# Patient Record
Sex: Male | Born: 1940 | Race: White | Hispanic: No | Marital: Married | Smoking: Never smoker
Health system: Southern US, Community
[De-identification: ages and names within clinical notes are randomized; demographics above are authoritative.]

## PROBLEM LIST (undated history)

## (undated) HISTORY — PX: EYE SURGERY: SHX253

---

## 2007-03-11 ENCOUNTER — Emergency Department (HOSPITAL_COMMUNITY): Admission: EM | Admit: 2007-03-11 | Discharge: 2007-03-11 | Payer: Self-pay | Admitting: Emergency Medicine

## 2010-10-11 LAB — CBC
HCT: 46.1
Hemoglobin: 15.9
MCHC: 34.5
Platelets: 178
RDW: 13.1

## 2010-10-11 LAB — BASIC METABOLIC PANEL
BUN: 9
CO2: 22
GFR calc non Af Amer: >60
Glucose, Bld: 114 — ABNORMAL HIGH
Potassium: 3.9
Sodium: 134 — ABNORMAL LOW

## 2010-10-11 LAB — DIFFERENTIAL
Basophils Absolute: 0
Basophils Relative: 1
Eosinophils Absolute: 0.1
Eosinophils Relative: 1
Lymphocytes Relative: 20
Monocytes Absolute: 0.5

## 2010-10-11 LAB — URINALYSIS, ROUTINE W REFLEX MICROSCOPIC
Bilirubin Urine: NEGATIVE
Ketones, ur: NEGATIVE
Leukocytes, UA: NEGATIVE
Nitrite: NEGATIVE
Protein, ur: 30 — AB
Urobilinogen, UA: 0.2

## 2010-10-11 LAB — RAPID URINE DRUG SCREEN, HOSP PERFORMED
Amphetamines: NOT DETECTED
Benzodiazepines: NOT DETECTED
Cocaine: NOT DETECTED
Tetrahydrocannabinol: NOT DETECTED

## 2010-10-11 LAB — URINE MICROSCOPIC-ADD ON

## 2012-07-08 ENCOUNTER — Emergency Department (HOSPITAL_COMMUNITY)
Admission: EM | Admit: 2012-07-08 | Discharge: 2012-07-09 | Disposition: A | Discharge status: Home / Self Care | Payer: Medicare PPO | Attending: Emergency Medicine | Admitting: Emergency Medicine

## 2012-07-08 ENCOUNTER — Encounter (HOSPITAL_COMMUNITY): Payer: Self-pay | Admitting: *Deleted

## 2012-07-08 DIAGNOSIS — R6889 Other general symptoms and signs: Secondary | ICD-10-CM | POA: Insufficient documentation

## 2012-07-08 DIAGNOSIS — Z7982 Long term (current) use of aspirin: Secondary | ICD-10-CM | POA: Insufficient documentation

## 2012-07-08 DIAGNOSIS — T4995XA Adverse effect of unspecified topical agent, initial encounter: Secondary | ICD-10-CM | POA: Insufficient documentation

## 2012-07-08 DIAGNOSIS — T7840XA Allergy, unspecified, initial encounter: Secondary | ICD-10-CM

## 2012-07-08 DIAGNOSIS — R21 Rash and other nonspecific skin eruption: Secondary | ICD-10-CM | POA: Insufficient documentation

## 2012-07-08 MED ORDER — FAMOTIDINE IN NACL 20-0.9 MG/50ML-% IV SOLN
20.0000 mg | Freq: Once | INTRAVENOUS | Status: AC
  Administered 2012-07-08: 20 mg via INTRAVENOUS
  Filled 2012-07-08: qty 50

## 2012-07-08 MED ORDER — SODIUM CHLORIDE 0.9 % IV BOLUS (SEPSIS)
1000.0000 mL | Freq: Once | INTRAVENOUS | Status: AC
  Administered 2012-07-08: 1000 mL via INTRAVENOUS

## 2012-07-08 MED ORDER — PREDNISONE 20 MG PO TABS: 20.0000 mg | ORAL_TABLET | Freq: Every day | ORAL | Status: DC

## 2012-07-08 MED ORDER — DIPHENHYDRAMINE HCL 50 MG/ML IJ SOLN
25.0000 mg | Freq: Once | INTRAMUSCULAR | Status: AC
  Administered 2012-07-08: 25 mg via INTRAVENOUS
  Filled 2012-07-08: qty 1

## 2012-07-08 MED ORDER — METHYLPREDNISOLONE SODIUM SUCC 125 MG IJ SOLR
125.0000 mg | Freq: Once | INTRAMUSCULAR | Status: AC
  Administered 2012-07-08: 125 mg via INTRAVENOUS
  Filled 2012-07-08: qty 2

## 2012-07-08 NOTE — ED Notes (Signed)
Pt w/ notable rash around mid section, & underarms. Pt states feels like throat is tightening up.

## 2012-07-08 NOTE — ED Provider Notes (Signed)
History     CSN: [id number]  Arrival date & time 07/08/12  2239   First MD Initiated Contact with Patient 07/08/12 2250      Chief Complaint  Patient presents with  . Rash  . throat tightening     (Consider location/radiation/quality/duration/timing/severity/associated sxs/prior treatment) HPI...Marland KitchenMarland KitchenMarland Kitchen rash, sense of throat tightening approximately one and a half hours ago.    No dyspnea, chest pain.   This is never happened before. No known allergens. Severity is moderate. No treatment at home.  History reviewed. No pertinent past medical history.  History reviewed. No pertinent past surgical history.  History reviewed. No pertinent family history.  History  Substance Use Topics  . Smoking status: Never Smoker   . Smokeless tobacco: Not on file  . Alcohol Use: Yes      Review of Systems  All other systems reviewed and are negative.    Allergies  Review of patient's allergies indicates no known allergies.  Home Medications   Current Outpatient Rx  Name  Route  Sig  Dispense  Refill  . aspirin 325 MG tablet   Oral   Take 325 mg by mouth daily.           BP 140/90  Pulse 71  Temp(Src) 97.3 F (36.3 C) (Oral)  Resp 16  Ht 6\' 1"  (1.854 m)  Wt 180 lb (81.647 kg)  BMI 23.75 kg/m2  SpO2 98%  Physical Exam  Nursing note and vitals reviewed. Constitutional: He is oriented to person, place, and time. He appears well-developed and well-nourished.  Good airway  HENT:  Head: Normocephalic and atraumatic.  Eyes: Conjunctivae and EOM are normal. Pupils are equal, round, and reactive to light.  Neck: Normal range of motion. Neck supple.  Cardiovascular: Normal rate, regular rhythm and normal heart sounds.   Pulmonary/Chest: Effort normal and breath sounds normal.  Abdominal: Soft. Bowel sounds are normal.  Musculoskeletal: Normal range of motion.  Neurological: He is alert and oriented to person, place, and time.  Skin:  Skin is flushed;  urticarial rash and  suprapubic area and perineal area.  Small satellite rash on forearms  Psychiatric: He has a normal mood and affect.    ED Course  Procedures (including critical care time)  Labs Reviewed - No data to display No results found.   No diagnosis found.    MDM  Uncertain etiology of allergic reaction.    Patient feeling better after IV Solu-Medrol, Benadryl, Pepcid        Donnetta Hutching, MD 07/11/12 [phone]

## 2012-07-08 NOTE — ED Notes (Signed)
Pt all of a sudden broke out in a rash an hour and a half ago and now states his throat feels tight.

## 2012-07-09 NOTE — ED Notes (Signed)
Pt alert & oriented x4, stable gait. Patient given discharge instructions, paperwork & prescription(s). Patient  instructed to stop at the registration desk to finish any additional paperwork. Patient verbalized understanding. Pt left department w/ no further questions. 

## 2012-08-17 ENCOUNTER — Encounter (HOSPITAL_COMMUNITY): Payer: Self-pay

## 2012-08-17 ENCOUNTER — Emergency Department (HOSPITAL_COMMUNITY)
Admission: EM | Admit: 2012-08-17 | Discharge: 2012-08-17 | Disposition: A | Discharge status: Home / Self Care | Payer: Medicare PPO | Attending: Emergency Medicine | Admitting: Emergency Medicine

## 2012-08-17 DIAGNOSIS — R221 Localized swelling, mass and lump, neck: Secondary | ICD-10-CM | POA: Insufficient documentation

## 2012-08-17 DIAGNOSIS — T7840XA Allergy, unspecified, initial encounter: Secondary | ICD-10-CM

## 2012-08-17 DIAGNOSIS — Z7982 Long term (current) use of aspirin: Secondary | ICD-10-CM | POA: Insufficient documentation

## 2012-08-17 DIAGNOSIS — Z79899 Other long term (current) drug therapy: Secondary | ICD-10-CM | POA: Insufficient documentation

## 2012-08-17 DIAGNOSIS — L500 Allergic urticaria: Secondary | ICD-10-CM | POA: Insufficient documentation

## 2012-08-17 DIAGNOSIS — R220 Localized swelling, mass and lump, head: Secondary | ICD-10-CM | POA: Insufficient documentation

## 2012-08-17 LAB — CBC WITH DIFFERENTIAL/PLATELET
Basophils Absolute: 0 10*3/uL (ref 0.0–0.1)
Basophils Relative: 0 % (ref 0–1)
Eosinophils Absolute: 0.1 10*3/uL (ref 0.0–0.7)
Eosinophils Relative: 1 % (ref 0–5)
HCT: 49.4 % (ref 39.0–52.0)
Hemoglobin: 17.6 g/dL — ABNORMAL HIGH (ref 13.0–17.0)
Lymphocytes Relative: 28 % (ref 12–46)
Lymphs Abs: 2.1 10*3/uL (ref 0.7–4.0)
MCH: 32.4 pg (ref 26.0–34.0)
MCHC: 35.6 g/dL (ref 30.0–36.0)
MCV: 91 fL (ref 78.0–100.0)
Monocytes Absolute: 0.6 10*3/uL (ref 0.1–1.0)
Monocytes Relative: 9 % (ref 3–12)
Neutro Abs: 4.6 10*3/uL (ref 1.7–7.7)
Neutrophils Relative %: 62 % (ref 43–77)
Platelets: 220 10*3/uL (ref 150–400)
RBC: 5.43 MIL/uL (ref 4.22–5.81)
RDW: 12.4 % (ref 11.5–15.5)
WBC: 7.4 10*3/uL (ref 4.0–10.5)

## 2012-08-17 LAB — BASIC METABOLIC PANEL
BUN: 10 mg/dL (ref 6–23)
CO2: 27 mEq/L (ref 19–32)
Calcium: 9.6 mg/dL (ref 8.4–10.5)
Chloride: 94 mEq/L — ABNORMAL LOW (ref 96–112)
Creatinine, Ser: 0.91 mg/dL (ref 0.50–1.35)
GFR calc Af Amer: >90 mL/min (ref >90)
GFR calc non Af Amer: 81 mL/min — ABNORMAL LOW (ref >90)
Glucose, Bld: 108 mg/dL — ABNORMAL HIGH (ref 70–99)
Potassium: 4.3 mEq/L (ref 3.5–5.1)
Sodium: 134 mEq/L — ABNORMAL LOW (ref 135–145)

## 2012-08-17 MED ORDER — EPINEPHRINE 0.3 MG/0.3ML IJ SOAJ
INTRAMUSCULAR | Status: AC
  Administered 2012-08-17: 0.3 mg
  Filled 2012-08-17: qty 0.3

## 2012-08-17 MED ORDER — DIPHENHYDRAMINE HCL 50 MG/ML IJ SOLN
25.0000 mg | Freq: Once | INTRAMUSCULAR | Status: AC
  Administered 2012-08-17: 25 mg via INTRAVENOUS

## 2012-08-17 MED ORDER — EPINEPHRINE 0.3 MG/0.3ML IJ SOAJ
INTRAMUSCULAR | Status: AC
  Filled 2012-08-17: qty 0.3

## 2012-08-17 MED ORDER — SODIUM CHLORIDE 0.9 % IV BOLUS (SEPSIS)
1000.0000 mL | Freq: Once | INTRAVENOUS | Status: AC
  Administered 2012-08-17: 1000 mL via INTRAVENOUS

## 2012-08-17 MED ORDER — EPINEPHRINE 0.3 MG/0.3ML IJ SOAJ: 0.3000 mg | INTRAMUSCULAR | Status: DC | PRN

## 2012-08-17 MED ORDER — DIPHENHYDRAMINE HCL 50 MG/ML IJ SOLN
INTRAMUSCULAR | Status: AC
  Filled 2012-08-17: qty 1

## 2012-08-17 MED ORDER — METHYLPREDNISOLONE SODIUM SUCC 125 MG IJ SOLR
INTRAMUSCULAR | Status: AC
  Administered 2012-08-17: 16:00:00
  Filled 2012-08-17: qty 2

## 2012-08-17 MED ORDER — FAMOTIDINE IN NACL 20-0.9 MG/50ML-% IV SOLN
INTRAVENOUS | Status: AC
  Administered 2012-08-17: 17:00:00
  Filled 2012-08-17: qty 50

## 2012-08-17 NOTE — ED Notes (Signed)
Pt complain of allergic reaction. States his tongue is swelling. States this is the second time this has happened but this time it is worse. Pt's face neck and stomach are red with raised rash.

## 2012-08-17 NOTE — ED Provider Notes (Signed)
CSN: [id number]     Arrival date & time 08/17/12  1512 History  This chart was scribed for Jose Razor, MD by Bennett Scrape, ED Scribe. This patient was seen in room [facility]07/[facility]07 and the patient's care was started at 3:18 PM.  None    Chief Complaint  Patient presents with  . Allergic Reaction    The history is provided by the patient. No language interpreter was used.    HPI Comments: Jose Villarreal is a 72 y.o. male who presents to the Emergency Department complaining of a sudden onset, gradually worsening allergic reaction described as neck swelling that started 20 minutes ago with associated tongue swelling and diffuse rash that started 15 minutes ago. Wife states that the pt's voice sounds "more sluggish" than normal as well. Pt took 2 benadryl and one ASA at the start of the symptoms with no improvement. He ate at [facility] earlier but denies eating anything new. He had a similar episode one month ago with an unknown cause. He was given IV Solu-Medrol, Benadryl and Pepcid with improvement. He denies being given prednisone and admits that he declined a prescription for epi pen. He denies any prior episodes before last month. He denies any recent move, changes in medication or changes in hygiene products. He denies abdominal pain, dizziness, lightheadedness and diarrhea.  No past medical history on file.  No past surgical history on file.  No family history on file.  History  Substance Use Topics  . Smoking status: Never Smoker   . Smokeless tobacco: Not on file  . Alcohol Use: Yes    Review of Systems  HENT: Negative for trouble swallowing.        Positive for neck and throat swelling  Gastrointestinal: Negative for abdominal pain and diarrhea.  Skin: Positive for rash.  Neurological: Negative for light-headedness.  All other systems reviewed and are negative.    Allergies  Review of patient's allergies indicates no known allergies.  Home Medications   Current  Outpatient Rx  Name  Route  Sig  Dispense  Refill  . aspirin 325 MG tablet   Oral   Take 325 mg by mouth daily.         . predniSONE (DELTASONE) 20 MG tablet   Oral   Take 1 tablet (20 mg total) by mouth daily.   3 tablet   2    Triage Vitals: Temp(Src) 97.7 F (36.5 C) (Oral)  Ht 6' (1.829 m)  Wt 176 lb 5 oz (79.975 kg)  BMI 23.91 kg/m2  SpO2 96%  Physical Exam  Nursing note and vitals reviewed. Constitutional: He is oriented to person, place, and time. He appears well-developed and well-nourished. No distress.  HENT:  Head: Normocephalic and atraumatic.  Swelling of tongue, handling secretions, voice sounds somewhat muffled   Eyes: Conjunctivae and EOM are normal.  Neck: Normal range of motion. Neck supple. No tracheal deviation present.  Swelling of neck  Cardiovascular: Normal rate, regular rhythm and normal heart sounds.   No murmur heard. Pulmonary/Chest: Effort normal and breath sounds normal. No stridor. No respiratory distress. He has no wheezes. He has no rales.  Abdominal: Soft. Bowel sounds are normal. There is no tenderness.  Musculoskeletal: Normal range of motion. He exhibits no edema.  Neurological: He is alert and oriented to person, place, and time. No cranial nerve deficit.  Skin: Skin is warm and dry. Rash noted.  Diffuse urticarial rash   Psychiatric: He has a normal mood and affect.  His behavior is normal.    ED Course   Procedures (including critical care time)  Medications  EPINEPHrine (EPI-PEN) 0.3 mg/0.3 mL injection (not administered)  diphenhydrAMINE (BENADRYL) 50 MG/ML injection (not administered)  methylPREDNISolone sodium succinate (SOLU-MEDROL) 125 mg/2 mL injection (not administered)  EPINEPHrine (EPI-PEN) 0.3 mg/0.3 mL injection (not administered)    DIAGNOSTIC STUDIES: Oxygen Saturation is 96% on room air, normal by my interpretation.    COORDINATION OF CARE: 3:23 PM-ED staff administered epi pen into left thigh. 3:32  PM-Discussed treatment plan which includes medications and observation for a rebound reaction with pt at bedside and pt agreed to plan.  3:36 PM-Pt rechecked and feels mildly improved. 6:22 PM- Pt rechecked and is feeling improved. Upon re-exam, tongue and neck swelling and rash have improved. Advised pt that observation should only be one more hour. 7:26 PM-Discussed discharge plan which includes epi pen and benadryl with pt and pt agreed to plan. Advised pt that if he uses the epi pen, he will need to be evaluated. Also advised pt to follow up with an allergist to determine the cause of his symptoms and pt agreed. Addressed symptoms to return for with pt.    1. Allergic reaction, initial encounter     MDM   6:32 PM Pt with much improved symptoms. Tongue and neck swelling resolved. Still has urticarial rash on arms, but improved. Continues to deny GI complaints. Has remained HD stable. Will continue to observe. If continues to improve anticipate discharge.   Patient was observed for 4 hours after administration of epinephrine. Symptoms continued to improve. Hemodynamically stable prior to discharge. Respiratory complaints. He still has a mild rash, but this is markedly improved as well. Will be discharge the prescription for epinephrine pens. Allergy/immunology followup. Emergent return precautions discussed.   I personally preformed the services scribed in my presence. The recorded information has been reviewed is accurate. Jose Razor, MD.   Jose Razor, MD 08/23/12 1027

## 2012-08-17 NOTE — ED Notes (Signed)
Pt presents via POV secondary to unknown etiology allergic reaction with noted tongue swelling and raised red rash to upper bilateral extremities, face and torso. Pt's swelling of tongue is improving after epi pen administration.  Dr Juleen China at bedside. SAO2 96 % on room air. Will continue to monitor

## 2018-10-30 ENCOUNTER — Emergency Department (HOSPITAL_COMMUNITY): Payer: Medicare Other

## 2018-10-30 ENCOUNTER — Encounter (HOSPITAL_COMMUNITY): Payer: Self-pay | Admitting: Emergency Medicine

## 2018-10-30 ENCOUNTER — Other Ambulatory Visit: Payer: Self-pay

## 2018-10-30 ENCOUNTER — Emergency Department (HOSPITAL_COMMUNITY)
Admission: EM | Admit: 2018-10-30 | Discharge: 2018-10-30 | Disposition: A | Discharge status: Home / Self Care | Payer: Medicare Other | Attending: Emergency Medicine | Admitting: Emergency Medicine

## 2018-10-30 DIAGNOSIS — I714 Abdominal aortic aneurysm, without rupture, unspecified: Secondary | ICD-10-CM

## 2018-10-30 DIAGNOSIS — R531 Weakness: Secondary | ICD-10-CM | POA: Diagnosis present

## 2018-10-30 DIAGNOSIS — K4020 Bilateral inguinal hernia, without obstruction or gangrene, not specified as recurrent: Secondary | ICD-10-CM | POA: Insufficient documentation

## 2018-10-30 LAB — COMPREHENSIVE METABOLIC PANEL
ALT: 14 U/L (ref 0–44)
AST: 32 U/L (ref 15–41)
Albumin: 3.5 g/dL (ref 3.5–5.0)
Alkaline Phosphatase: 61 U/L (ref 38–126)
Anion gap: 11 (ref 5–15)
BUN: 14 mg/dL (ref 8–23)
CO2: 24 mmol/L (ref 22–32)
Calcium: 8.8 mg/dL — ABNORMAL LOW (ref 8.9–10.3)
Chloride: 97 mmol/L — ABNORMAL LOW (ref 98–111)
Creatinine, Ser: 0.79 mg/dL (ref 0.61–1.24)
GFR calc Af Amer: >60 mL/min (ref >60)
GFR calc non Af Amer: >60 mL/min (ref >60)
Glucose, Bld: 100 mg/dL — ABNORMAL HIGH (ref 70–99)
Potassium: 4.4 mmol/L (ref 3.5–5.1)
Sodium: 132 mmol/L — ABNORMAL LOW (ref 135–145)
Total Bilirubin: 1 mg/dL (ref 0.3–1.2)
Total Protein: 7 g/dL (ref 6.5–8.1)

## 2018-10-30 LAB — URINALYSIS, ROUTINE W REFLEX MICROSCOPIC
Bacteria, UA: NONE SEEN
Bilirubin Urine: NEGATIVE
Glucose, UA: NEGATIVE mg/dL
Hgb urine dipstick: NEGATIVE
Ketones, ur: NEGATIVE mg/dL
Leukocytes,Ua: NEGATIVE
Nitrite: NEGATIVE
Protein, ur: 30 mg/dL — AB
Specific Gravity, Urine: 1.024 (ref 1.005–1.030)
pH: 7 (ref 5.0–8.0)

## 2018-10-30 LAB — CBC WITH DIFFERENTIAL/PLATELET
Abs Immature Granulocytes: 0.02 10*3/uL (ref 0.00–0.07)
Basophils Absolute: 0.1 10*3/uL (ref 0.0–0.1)
Basophils Relative: 1 %
Eosinophils Absolute: 0.1 10*3/uL (ref 0.0–0.5)
Eosinophils Relative: 1 %
HCT: 45.5 % (ref 39.0–52.0)
Hemoglobin: 14.9 g/dL (ref 13.0–17.0)
Immature Granulocytes: 0 %
Lymphocytes Relative: 9 %
Lymphs Abs: 0.8 10*3/uL (ref 0.7–4.0)
MCH: 30.9 pg (ref 26.0–34.0)
MCHC: 32.7 g/dL (ref 30.0–36.0)
MCV: 94.4 fL (ref 80.0–100.0)
Monocytes Absolute: 1 10*3/uL (ref 0.1–1.0)
Monocytes Relative: 11 %
Neutro Abs: 7.1 10*3/uL (ref 1.7–7.7)
Neutrophils Relative %: 78 %
Platelets: 313 10*3/uL (ref 150–400)
RBC: 4.82 MIL/uL (ref 4.22–5.81)
RDW: 11.9 % (ref 11.5–15.5)
WBC: 9 10*3/uL (ref 4.0–10.5)
nRBC: 0 % (ref 0.0–0.2)

## 2018-10-30 LAB — TROPONIN I (HIGH SENSITIVITY)
Troponin I (High Sensitivity): 5 ng/L (ref <18)
Troponin I (High Sensitivity): 5 ng/L (ref <18)

## 2018-10-30 LAB — CBG MONITORING, ED: Glucose-Capillary: 92 mg/dL (ref 70–99)

## 2018-10-30 LAB — LIPASE, BLOOD: Lipase: 46 U/L (ref 11–51)

## 2018-10-30 MED ORDER — IOHEXOL 300 MG/ML  SOLN
100.0000 mL | Freq: Once | INTRAMUSCULAR | Status: AC | PRN
  Administered 2018-10-30: 100 mL via INTRAVENOUS

## 2018-10-30 MED ORDER — SODIUM CHLORIDE 0.9% FLUSH: 3.0000 mL | Freq: Once | INTRAVENOUS | Status: DC

## 2018-10-30 MED ORDER — SODIUM CHLORIDE 0.9 % IV BOLUS
1000.0000 mL | Freq: Once | INTRAVENOUS | Status: AC
  Administered 2018-10-30: 1000 mL via INTRAVENOUS

## 2018-10-30 NOTE — Discharge Instructions (Signed)
You were seen today with generalized weakness.  Your tests today showed bilateral inguinal hernias and an enlarged aorta in your abdomen.  You should follow-up with the general surgeon listed for your hernias as they may require surgical repair.  Your abdominal aortic aneurysm requires follow-up ultrasound.  You will need to schedule an appointment with a primary care doctor to establish care who can help with this ultrasound scheduling and with your other issues.  Please return to the emergency department immediately if you develop any new or suddenly worsening symptoms.

## 2018-10-30 NOTE — ED Provider Notes (Signed)
Emergency Department Provider Note   I have reviewed the triage vital signs and the nursing notes.   HISTORY  Chief Complaint Weakness   HPI Jose Villarreal is a 78 y.o. male with no known PMH due to lack of PCP care presents to the emergency department with generalized weakness for "months" but worsening gradually.  Patient states he presents today because he is having difficulty with walking.  The weakness is diffuse and bilateral.  He describes it is worse and has lower extremities but now also involving his left upper extremity.  He denies pain, falls but does have to "crawl up on my tractor" at times.  He states that his stamina is relatively unchanged.  He is not having chest pain, palpitations, fever/chills, nausea, vomiting, diarrhea.  He does not take prescription medications.  He states he has not seen a doctor in approximately 10 years.  He rarely drinks alcohol and does not use drugs.    History reviewed. No pertinent past medical history.  There are no active problems to display for this patient.   History reviewed. No pertinent surgical history.  Allergies Patient has no known allergies.  No family history on file.  Social History Social History   Tobacco Use  . Smoking status: Never Smoker  . Smokeless tobacco: Never Used  Substance Use Topics  . Alcohol use: Yes  . Drug use: No    Review of Systems  Constitutional: No fever/chills. Positive generalized weakness.  Eyes: No visual changes. ENT: No sore throat. Cardiovascular: Denies chest pain. Respiratory: Denies shortness of breath. Gastrointestinal: No abdominal pain.  No nausea, no vomiting.  No diarrhea.  No constipation. Genitourinary: Negative for dysuria. Musculoskeletal: Negative for back pain. Skin: Negative for rash. Neurological: Negative for headaches, focal weakness or numbness.  10-point ROS otherwise negative.  ____________________________________________   PHYSICAL EXAM:   VITAL SIGNS: ED Triage Vitals  Enc Vitals Group     BP 10/30/18 0925 (!) 161/89     Pulse Rate 10/30/18 0925 (!) 107     Resp 10/30/18 0925 16     Temp 10/30/18 0925 97.8 F (36.6 C)     Temp Source 10/30/18 0925 Oral     SpO2 10/30/18 0925 97 %     Weight 10/30/18 0927 171 lb (77.6 kg)     Height 10/30/18 0927 5\' 10"  (1.778 m)   Constitutional: Alert and oriented. Well appearing and in no acute distress. Eyes: Conjunctivae are normal. PERRL.  Head: Atraumatic. Nose: No congestion/rhinnorhea. Mouth/Throat: Mucous membranes are moist.  Neck: No stridor.   Cardiovascular: Tachycardia. Good peripheral circulation. Grossly normal heart sounds.   Respiratory: Normal respiratory effort.  No retractions. Lungs CTAB. Gastrointestinal: Soft and nontender. No distention.  Musculoskeletal: No lower extremity tenderness nor edema. No gross deformities of extremities. Chronic pain in the right knee with limited ROM.  Neurologic:  Normal speech and language. No gross focal neurologic deficits are appreciated. 4+/5 strength in the hip flexors/extensors, quads.  Skin:  Skin is warm, dry and intact. No rash noted.  ____________________________________________   LABS (all labs ordered are listed, but only abnormal results are displayed)  Labs Reviewed  URINALYSIS, ROUTINE W REFLEX MICROSCOPIC - Abnormal; Notable for the following components:      Result Value   Color, Urine AMBER (*)    APPearance HAZY (*)    Protein, ur 30 (*)    All other components within normal limits  COMPREHENSIVE METABOLIC PANEL - Abnormal; Notable for the  following components:   Sodium 132 (*)    Chloride 97 (*)    Glucose, Bld 100 (*)    Calcium 8.8 (*)    All other components within normal limits  LIPASE, BLOOD  CBC WITH DIFFERENTIAL/PLATELET  CBG MONITORING, ED  TROPONIN I (HIGH SENSITIVITY)  TROPONIN I (HIGH SENSITIVITY)   ____________________________________________  EKG   EKG Interpretation   Date/Time:  Tuesday October 30 2018 09:32:23 EDT Ventricular Rate:  99 PR Interval:  192 QRS Duration: 90 QT Interval:  318 QTC Calculation: 408 R Axis:   -35 Text Interpretation:  Normal sinus rhythm Left axis deviation Abnormal ECG no STEMI  Confirmed by Alona Bene [phone]) on 10/30/2018 9:36:58 AM       ____________________________________________  RADIOLOGY  Ct Head Wo Contrast  Result Date: 10/30/2018 CLINICAL DATA:  Weakness for 2 weeks, difficulty ambulating EXAM: CT HEAD WITHOUT CONTRAST TECHNIQUE: Contiguous axial images were obtained from the base of the skull through the vertex without intravenous contrast. COMPARISON:  CT 03/11/2007 FINDINGS: Brain: No evidence of acute infarction, hemorrhage, hydrocephalus, extra-axial collection or mass lesion/mass effect. Scattered low-density changes within the periventricular and subcortical white matter compatible with chronic microvascular ischemic change. Mild diffuse cerebral volume loss. Vascular: Mild atherosclerotic calcifications involving the large vessels of the skull base. No unexpected hyperdense vessel. Skull: Normal. Negative for fracture or focal lesion. Sinuses/Orbits: Partial opacification within the right maxillary sinus. The remaining paranasal sinuses and mastoid air cells are clear. Orbital structures intact. Other: None. IMPRESSION: 1.  No acute intracranial findings. 2.  Chronic microvascular ischemic change and cerebral volume loss. 3.  Chronic right maxillary sinus disease. Electronically Signed   By: Duanne Guess M.D.   On: 10/30/2018 11:59   Mr Brain Wo Contrast  Result Date: 10/30/2018 CLINICAL DATA:  Ataxia, stroke suspected. Weakness for 2 weeks, difficulty walking. EXAM: MRI HEAD WITHOUT CONTRAST TECHNIQUE: Multiplanar, multiecho pulse sequences of the brain and surrounding structures were obtained without intravenous contrast. COMPARISON:  Head CT performed earlier the same day 10/30/2018, head CT  03/11/2007 FINDINGS: Brain: The coronal acquired T2 weighted imaging is significantly motion degraded. There is no convincing evidence of acute infarct. No evidence of intracranial mass. No midline shift or extra-axial fluid collection. No chronic intracranial blood products. Mild scattered T2/FLAIR hyperintensity within the cerebral white matter is nonspecific, but consistent with chronic small vessel ischemic disease. Tiny chronic lacunar infarct within the right cerebellum. Cerebral volume is normal for age. Vascular: There is a large intracranial left vertebral artery which supplies the basilar. The intracranial right vertebral artery is poorly delineated. Flow voids otherwise maintained within the proximal large arterial vessels. Skull and upper cervical spine: No focal marrow lesion Sinuses/Orbits: Visualized orbits demonstrate no acute abnormality. Mild ethmoid sinus mucosal thickening. Small right maxillary sinus mucous retention cyst. No significant mastoid effusion. IMPRESSION: 1. No evidence of acute intracranial abnormality, including acute infarction. 2. 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 3. The intracranial right vertebral artery is poorly delineated, however, likely developmentally small given the presence of a sizable left vertebral artery supplying the basilar artery. 4. Right maxillary sinus mucous retention cyst. Electronically Signed   By: Jackey Loge   On: 10/30/2018 14:11   Ct Abdomen Pelvis W Contrast  Result Date: 10/30/2018 CLINICAL DATA:  Weakness for 2 weeks. EXAM: CT ABDOMEN AND PELVIS WITH CONTRAST TECHNIQUE: Multidetector CT imaging of the abdomen and pelvis was performed using the standard protocol following bolus administration of  intravenous contrast. CONTRAST:  100 ML OMNIPAQUE IOHEXOL 300 MG/ML  SOLN COMPARISON:  None. FINDINGS: Lower chest: Mild dependent atelectasis in the lung bases. No pleural or pericardial  effusion. Hepatobiliary: A few small simple hepatic cysts are noted. Biliary tree is unremarkable. Pancreas: Unremarkable. No pancreatic ductal dilatation or surrounding inflammatory changes. Spleen: Normal in size without focal abnormality. Adrenals/Urinary Tract: The adrenal glands appear normal. A few small simple cysts are seen in each kidney. The kidneys otherwise appear normal. Ureters and urinary bladder are unremarkable. Stomach/Bowel: Stomach is within normal limits. Appendix appears normal. No evidence of bowel wall thickening, distention, or inflammatory changes. Vascular/Lymphatic: There is atherosclerosis. The patient has an abdominal aortic aneurysm measuring 3.7 cm AP by 3.3 cm transverse. Left common iliac artery aneurysm measures 2.2 cm and right common iliac artery aneurysm measures 1.8 cm. No lymphadenopathy. Reproductive: Prostate is unremarkable. Other: The patient has bilateral inguinal hernias. Hernia on the right is larger and contains a loop of small bowel. Musculoskeletal: No acute or focal abnormality. Multilevel degenerative change in the thoracic and lumbar spine noted. IMPRESSION: No acute abnormality. Bilateral inguinal hernias. Hernia on the right is larger and contains a loop of small bowel without obstruction or other complicating feature. 3.7 cm abdominal aortic aneurysm. Recommend followup by ultrasound in 2 years. This recommendation follows ACR consensus guidelines: White Paper of the ACR Incidental Findings Committee II on Vascular Findings. J Am Coll Radiol 2013; 10:789-794. 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 Electronically Signed   By: Inge Rise M.D.   On: 10/30/2018 14:11    ____________________________________________   PROCEDURES  Procedure(s) performed:   Procedures  None  ____________________________________________   INITIAL IMPRESSION / ASSESSMENT AND PLAN / ED COURSE   Pertinent labs & imaging results that were available during my care of the patient were reviewed by me and considered in my medical decision making (see chart for details).   Patient presents to the emergency department with progressive generalized weakness and difficulty walking.  No fever.  Vitals are significant for tachycardia with elevated blood pressure.  No hypoxemia or concern for infection.  No back or neck pain.  No headaches.  Patient has diminished strength throughout which is more pronounced in the lower extremities.  Upper extremity strength and sensation is normal.  No history to suggest an ascending type paralysis.  No rash.  Plan for screening blood work, IV fluids, CT head, and reassess.  CT head and lab work is largely unremarkable.  Obtain CT abdomen pelvis with contrast to evaluate hernias.  He does have bilateral inguinal hernias worse on the right but no obstruction or evidence of incarceration.  MRI brain obtained which shows no acute infarct.  Patient and wife at bedside for discussion.  Patient plans to obtain PCP ASAP.  I provided contact information to assist with this along with contact information for general surgery.  I offered home health, prescription for walker, prescription for wheelchair, but patient and wife declined this for now. Discussed ED return precautions.  ____________________________________________  FINAL CLINICAL IMPRESSION(S) / ED DIAGNOSES  Final diagnoses:  Generalized weakness  Non-recurrent bilateral inguinal hernia without obstruction or gangrene  Abdominal aortic aneurysm (AAA) without rupture (HCC)    MEDICATIONS GIVEN DURING THIS VISIT:  Medications  sodium chloride 0.9 % bolus 1,000 mL (0 mLs Intravenous Stopped 10/30/18 1225)  iohexol (OMNIPAQUE) 300 MG/ML solution 100 mL (100 mLs Intravenous Contrast  Given 10/30/18 1325)     Note:  This document was prepared using Dragon voice recognition software and may include unintentional  dictation errors.  Alona BeneJoshua Lyell Clugston, MD, [facility]    Keiri Solano, Arlyss RepressJoshua G, MD 10/31/18 [phone]

## 2018-10-30 NOTE — ED Triage Notes (Signed)
Patient states weakness x 2 weeks. Patient states problems ambulating due to weakness. Denies N/V/D.

## 2018-11-01 ENCOUNTER — Encounter: Payer: Self-pay | Admitting: General Surgery

## 2018-11-01 ENCOUNTER — Ambulatory Visit: Payer: Medicare Other | Admitting: General Surgery

## 2018-11-01 ENCOUNTER — Other Ambulatory Visit: Payer: Self-pay

## 2018-11-01 VITALS — BP 136/89 | HR 78 | Temp 97.1°F | Resp 18 | Ht 71.0 in | Wt 181.0 lb

## 2018-11-01 DIAGNOSIS — K4020 Bilateral inguinal hernia, without obstruction or gangrene, not specified as recurrent: Secondary | ICD-10-CM | POA: Diagnosis not present

## 2018-11-01 NOTE — Patient Instructions (Signed)
Inguinal Hernia, Adult °An inguinal hernia develops when fat or the intestines push through a weak spot in a muscle where your leg meets your lower abdomen (groin). This creates a bulge. This kind of hernia could also be: °· In your scrotum, if you are male. °· In folds of skin around your vagina, if you are male. °There are three types of inguinal hernias: °· Hernias that can be pushed back into the abdomen (are reducible). This type rarely causes pain. °· Hernias that are not reducible (are incarcerated). °· Hernias that are not reducible and lose their blood supply (are strangulated). This type of hernia requires emergency surgery. °What are the causes? °This condition is caused by having a weak spot in the muscles or tissues in the groin. This weak spot develops over time. The hernia may poke through the weak spot when you suddenly strain your lower abdominal muscles, such as when you: °· Lift a heavy object. °· Strain to have a bowel movement. Constipation can lead to straining. °· Cough. °What increases the risk? °This condition is more likely to develop in: °· Men. °· Pregnant women. °· People who: °? Are overweight. °? Work in jobs that require long periods of standing or heavy lifting. °? Have had an inguinal hernia before. °? Smoke or have lung disease. These factors can lead to long-lasting (chronic) coughing. °What are the signs or symptoms? °Symptoms may depend on the size of the hernia. Often, a small inguinal hernia has no symptoms. Symptoms of a larger hernia may include: °· A lump in the groin area. This is easier to see when standing. It might not be visible when lying down. °· Pain or burning in the groin. This may get worse when lifting, straining, or coughing. °· A dull ache or a feeling of pressure in the groin. °· In men, an unusual lump in the scrotum. °Symptoms of a strangulated inguinal hernia may include: °· A bulge in your groin that is very painful and tender to the touch. °· A bulge  that turns red or purple. °· Fever, nausea, and vomiting. °· Inability to have a bowel movement or to pass gas. °How is this diagnosed? °This condition is diagnosed based on your symptoms, your medical history, and a physical exam. Your health care provider may feel your groin area and ask you to cough. °How is this treated? °Treatment depends on the size of your hernia and whether you have symptoms. If you do not have symptoms, your health care provider may have you watch your hernia carefully and have you come in for follow-up visits. If your hernia is large or if you have symptoms, you may need surgery to repair the hernia. °Follow these instructions at home: °Lifestyle °· Avoid lifting heavy objects. °· Avoid standing for long periods of time. °· Do not use any products that contain nicotine or tobacco, such as cigarettes and e-cigarettes. If you need help quitting, ask your health care provider. °· Maintain a healthy weight. °Preventing constipation °· Take actions to prevent constipation. Constipation leads to straining with bowel movements, which can make a hernia worse or cause a hernia repair to break down. Your health care provider may recommend that you: °? Drink enough fluid to keep your urine pale yellow. °? Eat foods that are high in fiber, such as fresh fruits and vegetables, whole grains, and beans. °? Limit foods that are high in fat and processed sugars, such as fried or sweet foods. °? Take an over-the-counter   or prescription medicine for constipation. °General instructions °· You may try to push the hernia back in place by very gently pressing on it while lying down. Do not try to force the bulge back in if it will not push in easily. °· Watch your hernia for any changes in shape, size, or color. Get help right away if you notice any changes. °· Take over-the-counter and prescription medicines only as told by your health care provider. °· Keep all follow-up visits as told by your health care  provider. This is important. °Contact a health care provider if: °· You have a fever. °· You develop new symptoms. °· Your symptoms get worse. °Get help right away if: °· You have pain in your groin that suddenly gets worse. °· You have a bulge in your groin that: °? Suddenly gets bigger and does not get smaller. °? Becomes red or purple or painful to the touch. °· You are a man and you have a sudden pain in your scrotum, or the size of your scrotum suddenly changes. °· You cannot push the hernia back in place by very gently pressing on it when you are lying down. Do not try to force the bulge back in if it will not push in easily. °· You have nausea or vomiting that does not go away. °· You have a fast heartbeat. °· You cannot have a bowel movement or pass gas. °These symptoms may represent a serious problem that is an emergency. Do not wait to see if the symptoms will go away. Get medical help right away. Call your local emergency services (911 in the [place] °Summary °· An inguinal hernia develops when fat or the intestines push through a weak spot in a muscle where your leg meets your lower abdomen (groin). °· This condition is caused by having a weak spot in muscles or tissue in your groin. °· Symptoms may depend on the size of the hernia, and they may include pain or swelling in your groin. A small inguinal hernia often has no symptoms. °· Treatment may not be needed if you do not have symptoms. If you have symptoms or a large hernia, you may need surgery to repair the hernia. °· Avoid lifting heavy objects. Also avoid standing for long amounts of time. °This information is not intended to replace advice given to you by your health care provider. Make sure you discuss any questions you have with your health care provider. °Document Released: 05/22/2008 Document Revised: 02/04/2017 Document Reviewed: 10/05/2016 °Elsevier Patient Education © 2020 [employer] Inc[employer] ° °

## 2018-11-01 NOTE — Progress Notes (Signed)
Jose Villarreal; [id number]; 11-22-1940   HPI Patient is an 78 year old white male who was referred to my care by Benny Lennert for evaluation treatment of inguinal hernias.  Patient was recently seen in the emergency room for weakness and was found on examination to have bilateral inguinal hernias, right greater than left.  Patient states he recently noted the hernia and seems to have enlarged in size.  He is able to reduce it when lying flat.  He denies any nausea, vomiting, or pain in the inguinal regions.  He has 0 out of 10 groin pain.  They are sometimes worse with straining. History reviewed. No pertinent past medical history.  History reviewed. No pertinent surgical history.  History reviewed. No pertinent family history.  Current Outpatient Medications on File Prior to Visit  Medication Sig Dispense Refill  . aspirin EC 81 MG tablet Take 81 mg by mouth every morning.    Marland Kitchen aspirin 325 MG tablet Take 325 mg by mouth once.     Marland Kitchen EPINEPHrine (EPIPEN) 0.3 mg/0.3 mL SOAJ Inject 0.3 mLs (0.3 mg total) into the muscle as needed. (Patient not taking: Reported on 11/01/2018) 2 Device 1   No current facility-administered medications on file prior to visit.     No Known Allergies  Social History   Substance and Sexual Activity  Alcohol Use Yes    Social History   Tobacco Use  Smoking Status Never Smoker  Smokeless Tobacco Never Used    Review of Systems  Constitutional: Negative.   HENT: Negative.   Eyes: Negative.   Respiratory: Positive for cough.   Cardiovascular: Negative.   Gastrointestinal: Negative.   Genitourinary: Negative.   Musculoskeletal: Positive for joint pain.  Skin: Negative.   Neurological: Negative.   Endo/Heme/Allergies: Negative.   Psychiatric/Behavioral: Negative.     Objective   Vitals:   11/01/18 1103  BP: 136/89  Pulse: 78  Resp: 18  Temp: (!) 97.1 F (36.2 C)  SpO2: 97%    Physical Exam Vitals signs reviewed.  Constitutional:    Appearance: Normal appearance. He is not ill-appearing.  HENT:     Head: Normocephalic and atraumatic.  Cardiovascular:     Rate and Rhythm: Normal rate and regular rhythm.     Heart sounds: Normal heart sounds. No murmur. No friction rub. No gallop.   Pulmonary:     Effort: Pulmonary effort is normal. No respiratory distress.     Breath sounds: Normal breath sounds. No stridor. No wheezing, rhonchi or rales.  Abdominal:     General: Abdomen is flat. Bowel sounds are normal. There is no distension.     Palpations: Abdomen is soft. There is no mass.     Tenderness: There is no abdominal tenderness. There is no guarding or rebound.     Hernia: A hernia is present.     Comments: Bilateral inguinal hernias, right much larger than left.  Both easily reducible.  Genitourinary:    Comments: Genitourinary examination is within normal limits. Skin:    General: Skin is warm and dry.  Neurological:     Mental Status: He is alert and oriented to person, place, and time.    CT scan report reviewed ER notes reviewed Assessment  Bilateral inguinal hernias, right greater than left, asymptomatic Plan   As patient is asymptomatic, no need for surgical intervention at this time.  The risk of incarceration is low given the size of the hernias.  The signs and symptoms of incarceration were fully explained  to the patient and his wife.  Literature was given.  Should he become more symptomatic, he was instructed to return to my office.  He understands and agrees.  Follow-up as needed.

## 2018-11-13 ENCOUNTER — Ambulatory Visit: Payer: Self-pay | Admitting: General Surgery

## 2018-11-21 ENCOUNTER — Ambulatory Visit (INDEPENDENT_AMBULATORY_CARE_PROVIDER_SITE_OTHER): Payer: Medicare Other | Admitting: Family Medicine

## 2018-11-21 ENCOUNTER — Other Ambulatory Visit: Payer: Self-pay

## 2018-11-21 ENCOUNTER — Encounter: Payer: Self-pay | Admitting: Family Medicine

## 2018-11-21 ENCOUNTER — Other Ambulatory Visit (HOSPITAL_COMMUNITY)
Admission: RE | Admit: 2018-11-21 | Discharge: 2018-11-21 | Disposition: A | Discharge status: Home / Self Care | Payer: Medicare Other | Source: Ambulatory Visit | Attending: Family Medicine | Admitting: Family Medicine

## 2018-11-21 ENCOUNTER — Ambulatory Visit (HOSPITAL_COMMUNITY)
Admission: RE | Admit: 2018-11-21 | Discharge: 2018-11-21 | Disposition: A | Discharge status: Home / Self Care | Payer: Medicare Other | Source: Ambulatory Visit | Attending: Family Medicine | Admitting: Family Medicine

## 2018-11-21 VITALS — BP 130/90 | HR 96 | Temp 97.9°F | Ht 67.0 in | Wt 178.6 lb

## 2018-11-21 DIAGNOSIS — M47812 Spondylosis without myelopathy or radiculopathy, cervical region: Secondary | ICD-10-CM | POA: Diagnosis not present

## 2018-11-21 DIAGNOSIS — R009 Unspecified abnormalities of heart beat: Secondary | ICD-10-CM | POA: Insufficient documentation

## 2018-11-21 DIAGNOSIS — M791 Myalgia, unspecified site: Secondary | ICD-10-CM

## 2018-11-21 DIAGNOSIS — M25512 Pain in left shoulder: Secondary | ICD-10-CM | POA: Diagnosis present

## 2018-11-21 DIAGNOSIS — R030 Elevated blood-pressure reading, without diagnosis of hypertension: Secondary | ICD-10-CM | POA: Diagnosis not present

## 2018-11-21 DIAGNOSIS — M79602 Pain in left arm: Secondary | ICD-10-CM

## 2018-11-21 LAB — COMPREHENSIVE METABOLIC PANEL
ALT: 14 U/L (ref 0–44)
AST: 31 U/L (ref 15–41)
Albumin: 3.6 g/dL (ref 3.5–5.0)
Alkaline Phosphatase: 69 U/L (ref 38–126)
Anion gap: 12 (ref 5–15)
BUN: 13 mg/dL (ref 8–23)
CO2: 26 mmol/L (ref 22–32)
Calcium: 9.4 mg/dL (ref 8.9–10.3)
Chloride: 97 mmol/L — ABNORMAL LOW (ref 98–111)
Creatinine, Ser: 0.85 mg/dL (ref 0.61–1.24)
GFR calc Af Amer: >60 mL/min (ref >60)
GFR calc non Af Amer: >60 mL/min (ref >60)
Glucose, Bld: 101 mg/dL — ABNORMAL HIGH (ref 70–99)
Potassium: 4.6 mmol/L (ref 3.5–5.1)
Sodium: 135 mmol/L (ref 135–145)
Total Bilirubin: 0.8 mg/dL (ref 0.3–1.2)
Total Protein: 7.2 g/dL (ref 6.5–8.1)

## 2018-11-21 LAB — LIPID PANEL
Cholesterol: 169 mg/dL (ref 0–200)
HDL: 37 mg/dL — ABNORMAL LOW (ref >40)
LDL Cholesterol: 119 mg/dL — ABNORMAL HIGH (ref 0–99)
Total CHOL/HDL Ratio: 4.6 RATIO
Triglycerides: 66 mg/dL (ref <150)
VLDL: 13 mg/dL (ref 0–40)

## 2018-11-21 LAB — TSH: TSH: 2.37 u[IU]/mL (ref 0.350–4.500)

## 2018-11-21 LAB — CK: Total CK: 48 U/L — ABNORMAL LOW (ref 49–397)

## 2018-11-21 NOTE — Progress Notes (Signed)
New Patient Office Visit  Subjective:  Patient ID: Jose Villarreal, male    DOB: [birth year]  Age: 78 y.o. MRN: [id number]  CC:  Chief Complaint  Patient presents with  . Establish Care  . AAA    needs additional imaging ([place])    HPI Jose Villarreal presents for concern about muscle weakness, left arm pain  pt with appt for cardiology evaluation-concern for weakness and need for AAA-no h/o of tob use Left arm pain-no injury x 4 months-associated with shoulder pain Muscle weakness-noted over the last few months-difficulty with climbing into the tractor. Hernia evaluation-no surgery recommended-pt taking laxatives to prevent straining, no blood noted in stool   Social History  [place] farm, retired Pharmacologist Socioeconomic History  . Marital status: Married    Spouse name: Not on file  . Number of children: Not on file  . Years of education: Not on file  . Highest education level: Not on file  Occupational History  . Not on file  Social Needs  . Financial resource strain: Not on file  . Food insecurity    Worry: Not on file    Inability: Not on file  . Transportation needs    Medical: Not on file    Non-medical: Not on file  Tobacco Use  . Smoking status: Never Smoker  . Smokeless tobacco: Never Used  Substance and Sexual Activity  . Alcohol use: Yes  . Drug use: No  . Sexual activity: Not on file  Lifestyle  . Physical activity    Days per week: Not on file    Minutes per session: Not on file  . Stress: Not on file  Relationships  . Social Herbalist on phone: Not on file    Gets together: Not on file    Attends religious service: Not on file    Active member of club or organization: Not on file    Attends meetings of clubs or organizations: Not on file    Relationship status: Not on file  . Intimate partner violence    Fear of current or ex partner: Not on file    Emotionally abused: Not on file    Physically abused: Not on file   Forced sexual activity: Not on file  Other Topics Concern  . Not on file  Social History Narrative  . Not on file   ROS Review of Systems  Constitutional: Positive for fatigue.  HENT:       Maxillary sinus -chronic noted on MRI  Eyes:       Readers  Respiratory: Negative.   Cardiovascular: Negative.   Gastrointestinal: Negative for constipation and diarrhea.  Genitourinary: Positive for frequency.       Nocturia-2-3 times a day  Musculoskeletal: Positive for myalgias.  Neurological: Negative for numbness and headaches.  Hematological: Negative.   Psychiatric/Behavioral: Negative for sleep disturbance.    Objective:   Today's Vitals: BP 130/90 (BP Location: Left Arm, Patient Position: Sitting, Cuff Size: Normal)   Pulse 96   Temp 97.9 F (36.6 C) (Oral)   Ht 5\' 7"  (1.702 m)   Wt 178 lb 9.6 oz (81 kg)   SpO2 98%   BMI 27.97 kg/m   Physical Exam Constitutional:      Appearance: Normal appearance.  HENT:     Head: Normocephalic and atraumatic.     Right Ear: Tympanic membrane, ear canal and external ear normal.     Left Ear: Tympanic  membrane, ear canal and external ear normal.     Nose: Nose normal.     Mouth/Throat:     Mouth: Mucous membranes are moist.  Eyes:     Conjunctiva/sclera: Conjunctivae normal.  Neck:     Musculoskeletal: Normal range of motion.  Cardiovascular:     Rate and Rhythm: Normal rate and regular rhythm.     Pulses: Normal pulses.  Neurological:     Mental Status: He is alert.     Assessment & Plan:    Outpatient Encounter Medications as of 11/21/2018  Medication Sig  . aspirin 325 MG tablet Take 325 mg by mouth once.   Marland Kitchen aspirin EC 81 MG tablet Take 81 mg by mouth every morning.  Marland Kitchen EPINEPHrine (EPIPEN) 0.3 mg/0.3 mL SOAJ Inject 0.3 mLs (0.3 mg total) into the muscle as needed. (Patient not taking: Reported on 11/01/2018)   No facility-administered encounter medications on file as of 11/21/2018.    1. Myalgia Muscle weakness noted  with getting into the tractor - COMPLETE METABOLIC PANEL WITH GFR - Lipid panel - TSH - CK (Creatine Kinase)  2. Elevated heart rate with elevated blood pressure without diagnosis of hypertension Elevated in the ER-no medication-no CP/SOB - COMPLETE METABOLIC PANEL WITH GFR - Lipid panel - TSH - CK (Creatine Kinase)  3. 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 Left shoulder pain, unspecified chronicity - DG Humerus Left; Future - DG Cervical Spine Complete; Future No injury, no weakness, LROM left shoulder, anterior pain with palpation Follow-up: prn-recommend bp monitor for home use LISA Mat Carne, MD

## 2018-11-21 NOTE — Patient Instructions (Signed)
Fasting labwork-[facility]  xrays at [facility]  Blood pressure monitor

## 2018-12-04 ENCOUNTER — Encounter: Payer: Self-pay | Admitting: Cardiovascular Disease

## 2018-12-04 ENCOUNTER — Other Ambulatory Visit: Payer: Self-pay

## 2018-12-04 ENCOUNTER — Ambulatory Visit: Payer: Medicare Other | Admitting: Cardiovascular Disease

## 2018-12-04 VITALS — BP 151/84 | HR 82 | Temp 97.9°F | Ht 68.0 in | Wt 177.0 lb

## 2018-12-04 DIAGNOSIS — I10 Essential (primary) hypertension: Secondary | ICD-10-CM

## 2018-12-04 DIAGNOSIS — R531 Weakness: Secondary | ICD-10-CM | POA: Diagnosis not present

## 2018-12-04 DIAGNOSIS — I714 Abdominal aortic aneurysm, without rupture, unspecified: Secondary | ICD-10-CM

## 2018-12-04 NOTE — Patient Instructions (Signed)
Medication Instructions:  Your physician recommends that you continue on your current medications as directed. Please refer to the Current Medication list given to you today.  *If you need a refill on your cardiac medications before your next appointment, please call your pharmacy*  Lab Work: None today  If you have labs (blood work) drawn today and your tests are completely normal, you will receive your results only by: Marland Kitchen MyChart Message (if you have MyChart) OR . A paper copy in the mail If you have any lab test that is abnormal or we need to change your treatment, we will call you to review the results.  Testing/Procedures: None today    Follow-Up: As needed      Thank you for choosing [facility] [facility] !

## 2018-12-04 NOTE — Progress Notes (Signed)
CARDIOLOGY CONSULT NOTE  Patient ID: Jose Villarreal MRN: [id number] DOB/AGE: [birth year] 78 y.o.  Admit date: (Not on file) Primary Physician: Maryruth Hancock, MD  Reason for Consultation: Weakness  HPI: Jose Villarreal is a 78 y.o. male who is being seen today for the evaluation of weakness at the request of Dr. Benny Lennert.  He was evaluated in the ED for weakness on 10/30/2018.  I reviewed labs performed on 11/21/2018 which include normal TSH.  Lipid panel showed LDL 119.  High-sensitivity troponins were normal on 10/30/2018.  I personally reviewed the ECG performed on 10/30/2018 which demonstrated sinus rhythm with left axis deviation.  He believes he had some sort of a viral illness in October which she has since recovered from.  He denies chest pain, palpitations, leg swelling, orthopnea and shortness of breath.  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  Social history: His wife, Izora Gala, is also my patient.  No Known Allergies  Current Outpatient Medications  Medication Sig Dispense Refill  . aspirin EC 81 MG tablet Take 81 mg by mouth every morning.     No current facility-administered medications for this visit.     History reviewed. No pertinent past medical history.  History reviewed. No pertinent surgical history.  Social History   Socioeconomic History  . Marital status: Married    Spouse name: Not on file  . Number of children: Not on file  . Years of education: Not on file  . Highest education level: Not on file  Occupational History  . Not on file  Social Needs  . Financial resource strain: Not on file  . Food insecurity    Worry: Not on file    Inability: Not on file  . Transportation needs    Medical: Not on file    Non-medical: Not on file  Tobacco Use  . Smoking status: Never Smoker  . Smokeless tobacco: Never Used  Substance and Sexual Activity  .  Alcohol use: Yes  . Drug use: No  . Sexual activity: Not on file  Lifestyle  . Physical activity    Days per week: Not on file    Minutes per session: Not on file  . Stress: Not on file  Relationships  . Social Herbalist on phone: Not on file    Gets together: Not on file    Attends religious service: Not on file    Active member of club or organization: Not on file    Attends meetings of clubs or organizations: Not on file    Relationship status: Not on file  . Intimate partner violence    Fear of current or ex partner: Not on file    Emotionally abused: Not on file    Physically abused: Not on file    Forced sexual activity: Not on file  Other Topics Concern  . Not on file  Social History Narrative  . Not on file     No family history of premature CAD in 1st degree relatives.  Current Meds  Medication Sig  . aspirin EC 81 MG tablet Take 81 mg by mouth every morning.      Review of systems complete and found to be negative unless listed above in HPI    Physical exam Blood pressure (!) 151/84, pulse 82, temperature 97.9 F (36.6 C), height 5'  8" (1.727 m), weight 177 lb (80.3 kg), SpO2 97 %. General: NAD Neck: No JVD, no thyromegaly or thyroid nodule.  Lungs: Clear to auscultation bilaterally with normal respiratory effort. CV: Nondisplaced PMI. Regular rate and rhythm, normal S1/S2, no S3/S4, no murmur.  No peripheral edema.  No carotid bruit.    Abdomen: Soft, nontender, no distention.  Skin: Intact without lesions or rashes.  Neurologic: Alert and oriented x 3.  Psych: Normal affect. Extremities: No clubbing or cyanosis.  HEENT: Normal.   ECG: Most recent ECG reviewed.   Labs: Lab Results  Component Value Date/Time   K 4.6 11/21/2018 11:13 AM   BUN 13 11/21/2018 11:13 AM   CREATININE 0.85 11/21/2018 11:13 AM   ALT 14 11/21/2018 11:13 AM   TSH 2.370 11/21/2018 11:14 AM   HGB 14.9 10/30/2018 10:08 AM     Lipids: Lab Results  Component  Value Date/Time   LDLCALC 119 (H) 11/21/2018 11:14 AM   CHOL 169 11/21/2018 11:14 AM   TRIG 66 11/21/2018 11:14 AM   HDL 37 (L) 11/21/2018 11:14 AM        ASSESSMENT AND PLAN:   1.  Weakness: Symptomatically improved.  He believes he may have had a viral illness back in October.  Labs reviewed above are unrevealing.  ECG is unremarkable.  He is hypertensive.  2.  Hypertension: Blood pressure is elevated.  He is not on antihypertensive therapy.  This will need further monitoring by his PCP.  3.  Abdominal aortic aneurysm: CT abdomen on 10/30/2018 demonstrated a 3.7 cm abdominal aortic aneurysm.  Repeat abdominal imaging should be performed in 2 years.    Disposition: Follow up prn  Signed: Prentice Docker, M.D., F.A.C.C.  12/04/2018, 1:34 PM

## 2021-06-11 IMAGING — DX DG HUMERUS 2V *L*
2 series · 3 of 3 positions shown · non-contrast
Comparison: None.

CLINICAL DATA: Left shoulder/arm pain radiating down left arm 3-4
months. No injury.

EXAM:
LEFT HUMERUS - 2+ VIEW

[humerus ap]
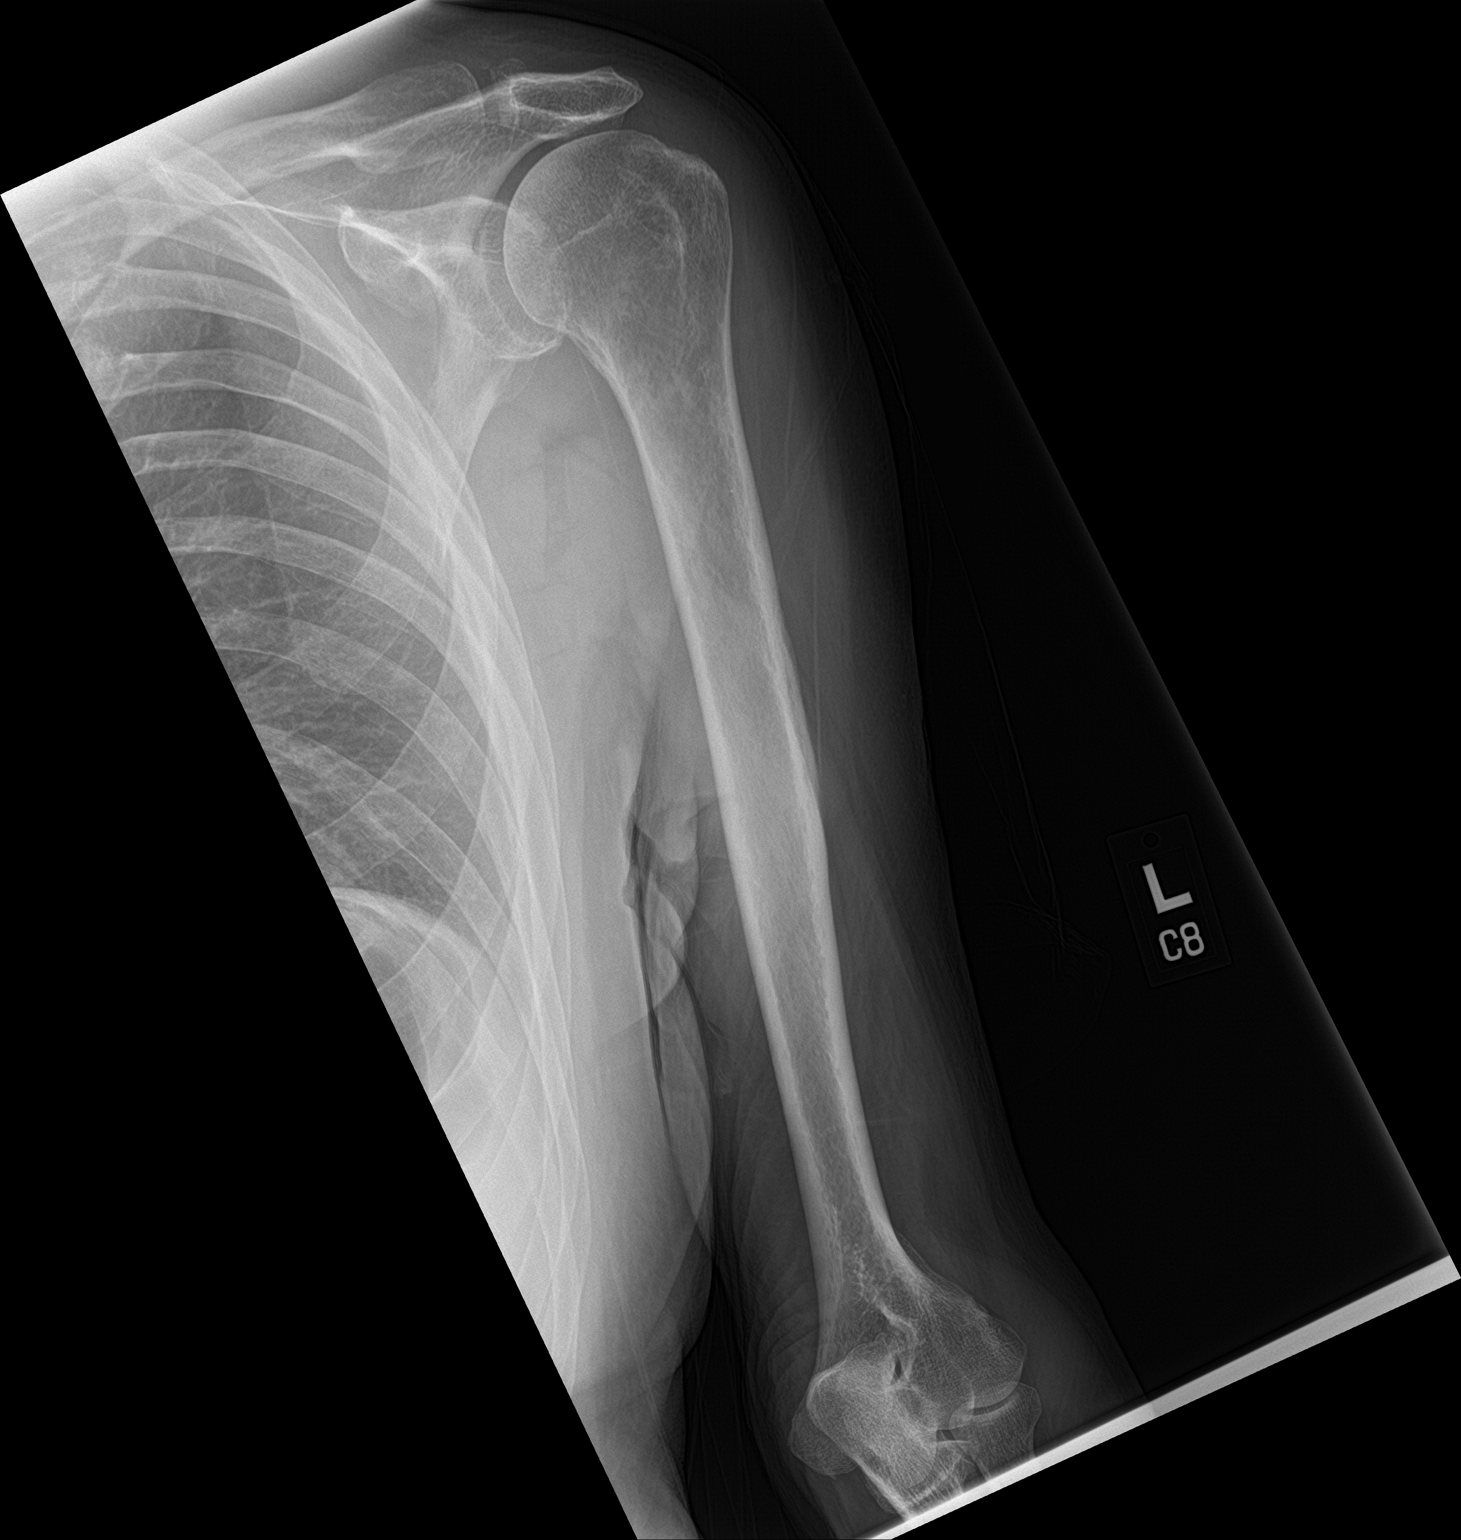

[Series 2: humerus lat · 0.14mm/px · 2 of 2 slices shown]
[im 1/2]
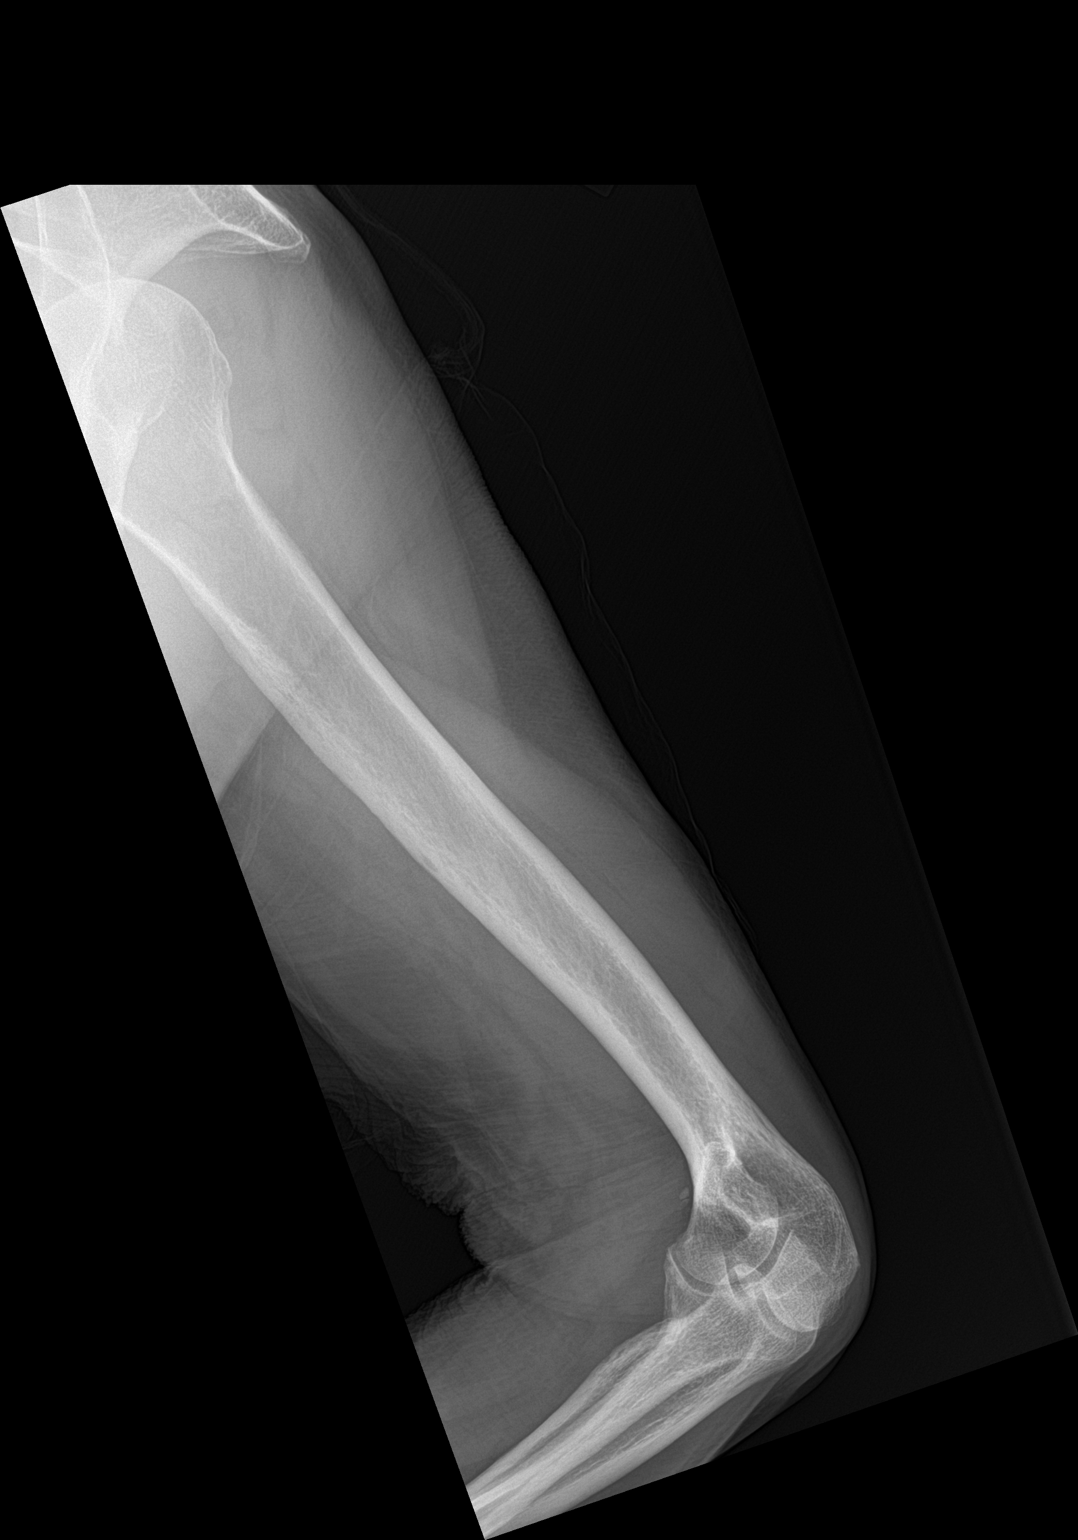
[im 2/2]
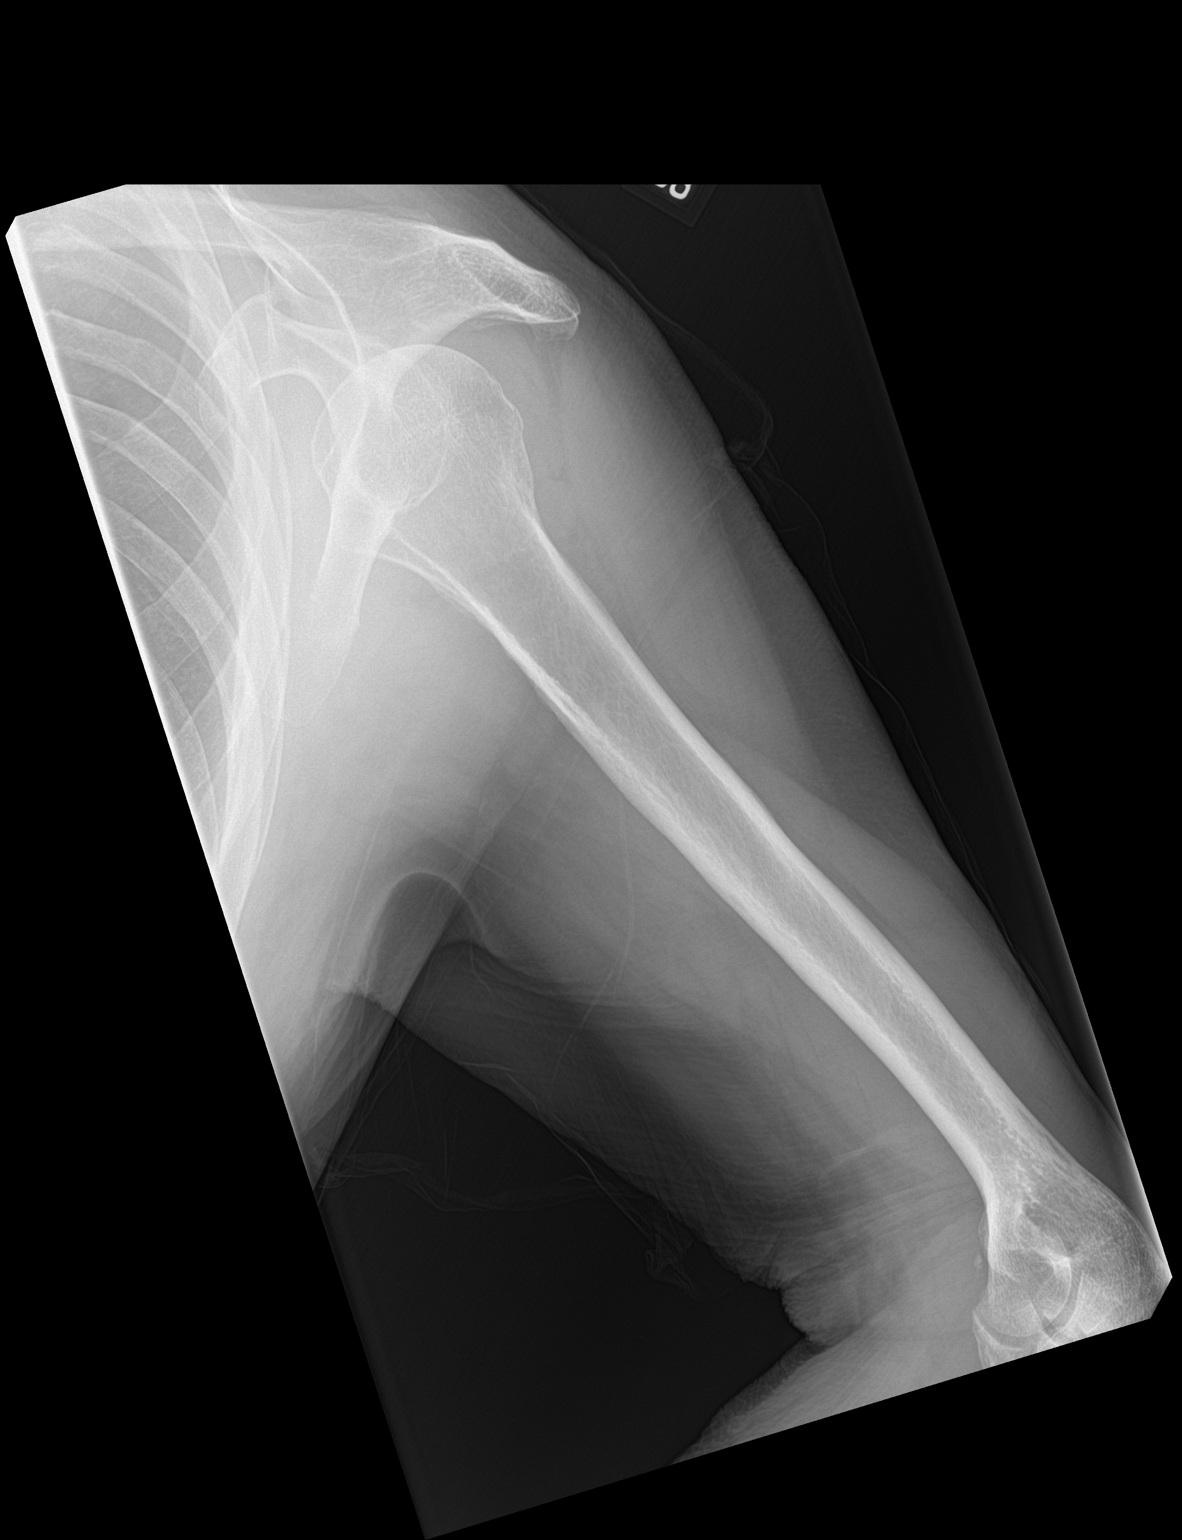

[3 of 3 positions shown; findings below may reference images not displayed]

FINDINGS: Minimal degenerative change over the AC joint. Glenohumeral joint is
within normal. No evidence of acute fracture or dislocation.
Remainder of the visualized left humerus is normal.
IMPRESSION: No acute findings.

## 2022-02-28 ENCOUNTER — Ambulatory Visit (INDEPENDENT_AMBULATORY_CARE_PROVIDER_SITE_OTHER): Payer: Medicare PPO

## 2022-02-28 ENCOUNTER — Encounter: Payer: Self-pay | Admitting: Orthopedic Surgery

## 2022-02-28 ENCOUNTER — Ambulatory Visit: Payer: Self-pay

## 2022-02-28 ENCOUNTER — Ambulatory Visit (INDEPENDENT_AMBULATORY_CARE_PROVIDER_SITE_OTHER): Payer: Medicare PPO | Admitting: Orthopedic Surgery

## 2022-02-28 DIAGNOSIS — M25561 Pain in right knee: Secondary | ICD-10-CM | POA: Diagnosis not present

## 2022-02-28 DIAGNOSIS — M170 Bilateral primary osteoarthritis of knee: Secondary | ICD-10-CM | POA: Diagnosis not present

## 2022-02-28 MED ORDER — BUPIVACAINE HCL 0.25 % IJ SOLN
4.0000 mL | INTRAMUSCULAR | Status: AC | PRN
  Administered 2022-02-28: 4 mL via INTRA_ARTICULAR

## 2022-02-28 MED ORDER — LIDOCAINE HCL 1 % IJ SOLN
5.0000 mL | INTRAMUSCULAR | Status: AC | PRN
  Administered 2022-02-28: 5 mL

## 2022-02-28 NOTE — Progress Notes (Signed)
Office Visit Note   Patient: Jose Villarreal           Date of Birth: November 27, 1940           MRN: [id number] Visit Date: 02/28/2022 Requested by: No referring provider defined for this encounter. PCP: Pcp, No  Subjective: Chief Complaint  Patient presents with   Right Knee - Pain    HPI: Jose Villarreal is a 82 y.o. male who presents to the office reporting right knee pain.  Patient has chronic issues with the right knee.  Describes weakness giving way but no locking.  Pain does not wake him from sleep but he does have some pain at rest.  Definitely has symptoms when he is ambulating and with activity.  Tried a brace years ago but it caused increased swelling.  He has to be careful how he ambulates.  Hard for him to stand as well as climb stairs.  His knee pain does limit his walking endurance.  He is okay sitting and driving..                ROS: All systems reviewed are negative as they relate to the chief complaint within the history of present illness.  Patient denies fevers or chills.  Assessment & Plan: Visit Diagnoses:  1. Right knee pain, unspecified chronicity   2. Primary osteoarthritis of both knees     Plan: Impression is right knee pain and arthritis with asymptomatic left knee.  He has a Baker's cyst in the back of the right knee as well.  Plan is aspiration and injection today.  That is going to help him for some amount of time.  Could consider gel injection as a neck step.  The Baker's cyst is also aspirated of about 15 cc of fluid.  Follow-up as needed.  Follow-Up Instructions: No follow-ups on file.   Orders:  Orders Placed This Encounter  Procedures   XR Knee 1-2 Views Right   US Guided Needle Placement - No Linked Charges   No orders of the defined types were placed in this encounter.     Procedures: Large Joint Inj: R knee on 02/28/2022 8:28 PM Indications: diagnostic evaluation, joint swelling and pain Details: 18 G 1.5 in needle, ultrasound-guided  posterior approach  Arthrogram: No  Medications: 5 mL lidocaine 1 %; 4 mL bupivacaine 0.25 % Aspirate: yellow Outcome: tolerated well, no immediate complications  15 cc aspirated from posterior knee Procedure, treatment alternatives, risks and benefits explained, specific risks discussed. Consent was given by the patient. Immediately prior to procedure a time out was called to verify the correct patient, procedure, equipment, support staff and site/side marked as required. Patient was prepped and draped in the usual sterile fashion.       Clinical Data: No additional findings.  Objective: Vital Signs: There were no vitals taken for this visit.  Physical Exam:  Constitutional: Patient appears well-developed HEENT:  Head: Normocephalic Eyes:EOM are normal Neck: Normal range of motion Cardiovascular: Normal rate Pulmonary/chest: Effort normal Neurologic: Patient is alert Skin: Skin is warm Psychiatric: Patient has normal mood and affect  Ortho Exam: Ortho exam demonstrates varus alignment in the right knee and to a lesser degree left knee.  Pedal pulses palpable.  Range of motion on the right is about 7-1 15.  Has slight laxity to varus and valgus stress.  Extensor mechanism intact.  No groin pain with internal or external rotation of the right knee.Or leg.  Specialty Comments:  No specialty comments available.  Imaging: No results found.   PMFS History: Patient Active Problem List   Diagnosis Date Noted   Myalgia 11/21/2018   Elevated heart rate with elevated blood pressure without diagnosis of hypertension 11/21/2018   Left arm pain 11/21/2018   Left shoulder pain 11/21/2018   No past medical history on file.  Family History  Problem Relation Age of Onset   Parkinson's disease Mother    Heart failure Father     No past surgical history on file. Social History   Occupational History   Not on file  Tobacco Use   Smoking status: Never   Smokeless tobacco:  Never  Vaping Use   Vaping Use: Never used  Substance and Sexual Activity   Alcohol use: Yes   Drug use: No   Sexual activity: Not on file

## 2022-05-09 ENCOUNTER — Ambulatory Visit: Payer: Medicare PPO | Admitting: Family Medicine

## 2022-05-09 ENCOUNTER — Encounter: Payer: Self-pay | Admitting: Family Medicine

## 2022-05-09 VITALS — BP 164/80 | HR 65 | Temp 97.6°F | Ht 68.0 in | Wt 182.0 lb

## 2022-05-09 DIAGNOSIS — R011 Cardiac murmur, unspecified: Secondary | ICD-10-CM

## 2022-05-09 DIAGNOSIS — R030 Elevated blood-pressure reading, without diagnosis of hypertension: Secondary | ICD-10-CM

## 2022-05-09 DIAGNOSIS — I499 Cardiac arrhythmia, unspecified: Secondary | ICD-10-CM

## 2022-05-09 DIAGNOSIS — I714 Abdominal aortic aneurysm, without rupture, unspecified: Secondary | ICD-10-CM

## 2022-05-09 DIAGNOSIS — K4090 Unilateral inguinal hernia, without obstruction or gangrene, not specified as recurrent: Secondary | ICD-10-CM

## 2022-05-09 MED ORDER — LOSARTAN POTASSIUM 50 MG PO TABS: 50.0000 mg | ORAL_TABLET | Freq: Every day | ORAL | 3 refills | Status: AC

## 2022-05-09 NOTE — Patient Instructions (Signed)
It was great to meet you today and I'm excited to have you join the [facility] practice. I hope you had a positive experience today! If you feel so inclined, please feel free to recommend our practice to friends and family. Autymn Omlor, FNP-C  

## 2022-05-09 NOTE — Assessment & Plan Note (Signed)
He is interested in speaking with general surgeon, referral placed.

## 2022-05-09 NOTE — Progress Notes (Signed)
New Patient Office Visit  Subjective    Patient ID: Jose Villarreal, male    DOB: 27-Jun-1940  Age: 82 y.o. MRN: [id number]  CC:  Chief Complaint  Patient presents with   Establish Care    HPI Jose Villarreal presents to establish care. Oriented to practice routines and expectations. He denies any major medical history other than alpha gal and right knee arthritis that he is getting injections with orthopedics for. Review of cardiology note from 2020 reveals elevated blood pressure, and AAA (repeat imaging 2y recommended, not completed).    Outpatient Encounter Medications as of 05/09/2022  Medication Sig   losartan (COZAAR) 50 MG tablet Take 1 tablet (50 mg total) by mouth daily.   [DISCONTINUED] aspirin EC 81 MG tablet Take 81 mg by mouth every morning.   No facility-administered encounter medications on file as of 05/09/2022.    History reviewed. No pertinent past medical history.  Past Surgical History:  Procedure Laterality Date   EYE SURGERY      Family History  Problem Relation Age of Onset   Parkinson's disease Mother    Heart failure Father     Social History   Socioeconomic History   Marital status: Married    Spouse name: Not on file   Number of children: Not on file   Years of education: Not on file   Highest education level: Not on file  Occupational History   Not on file  Tobacco Use   Smoking status: Never   Smokeless tobacco: Never  Vaping Use   Vaping Use: Never used  Substance and Sexual Activity   Alcohol use: Yes   Drug use: No   Sexual activity: Not on file  Other Topics Concern   Not on file  Social History Narrative   Not on file   Social Determinants of Health   Financial Resource Strain: Not on file  Food Insecurity: Not on file  Transportation Needs: Not on file  Physical Activity: Not on file  Stress: Not on file  Social Connections: Not on file  Intimate Partner Violence: Not on file    Review of Systems   Constitutional: Negative.   HENT: Negative.    Eyes: Negative.   Respiratory: Negative.    Cardiovascular: Negative.   Gastrointestinal: Negative.   Genitourinary: Negative.   Musculoskeletal: Negative.   Skin: Negative.   Neurological: Negative.   Endo/Heme/Allergies: Negative.   Psychiatric/Behavioral: Negative.    All other systems reviewed and are negative.       Objective    BP (!) 164/80   Pulse 65   Temp 97.6 F (36.4 C) (Oral)   Ht  (1.727 m)   Wt 182 lb (82.6 kg)   SpO2 97%   BMI 27.67 kg/m     05/09/2022    9:45 AM 05/09/2022    9:37 AM 12/04/2018    1:21 PM  Vitals with BMI  Height     Weight  182 lbs 177 lbs  BMI  27.68 26.92  Systolic 164 168 409  Diastolic 80 90 84  Pulse  65 82      Physical Exam Vitals and nursing note reviewed.  Constitutional:      Appearance: Normal appearance. He is normal weight.  HENT:     Head: Normocephalic and atraumatic.     Nose: Nose normal.     Mouth/Throat:     Mouth: Mucous membranes are moist.     Pharynx:  Oropharynx is clear.  Eyes:     Extraocular Movements: Extraocular movements intact.     Right eye: Normal extraocular motion and no nystagmus.     Left eye: Normal extraocular motion and no nystagmus.     Conjunctiva/sclera: Conjunctivae normal.     Pupils: Pupils are equal, round, and reactive to light.  Cardiovascular:     Rate and Rhythm: Normal rate. Rhythm irregular.     Pulses: Normal pulses.     Heart sounds: Normal heart sounds.  Pulmonary:     Effort: Pulmonary effort is normal.     Breath sounds: Normal breath sounds.  Abdominal:     General: Bowel sounds are normal.     Palpations: Abdomen is soft.     Hernia: A hernia is present. Hernia is present in the right inguinal area.  Genitourinary:    Comments: Deferred using shared decision making Musculoskeletal:        General: Normal range of motion.     Cervical back: Normal range of motion and neck supple.  Skin:     General: Skin is warm and dry.     Capillary Refill: Capillary refill takes less than 2 seconds.  Neurological:     General: No focal deficit present.     Mental Status: He is alert. Mental status is at baseline.  Psychiatric:        Mood and Affect: Mood normal.        Speech: Speech normal.        Behavior: Behavior normal.        Thought Content: Thought content normal.        Cognition and Memory: Cognition and memory normal.        Judgment: Judgment normal.         Assessment & Plan:   Problem List Items Addressed This Visit     Elevated blood pressure reading in office without diagnosis of hypertension - Primary    Start Losartan 50mg  daily and follow up in 2 weeks. Instructed to seek medical care for chest pain, shortness of breath, lightheadedness/dizziness, vision changes, swelling of extremities, or recurrent headaches. Strongly encouraged to obtain BP cuff for home monitoring and report to office if sustained >180/120 or <110/60.      Relevant Orders   CBC with Differential/Platelet   COMPLETE METABOLIC PANEL WITH GFR   Lipid panel   Ambulatory referral to Cardiology   Abdominal aortic aneurysm (AAA) without rupture    Chronic, found during ED visit in 2020, 3.7cm, recommended 2y follow-up with [place] not done. Will order [place] and cardiology referral. BP remained elevated in office, he does not check at home. Elevated at prior visit. Start Losartan 50mg  daily and follow up in 2 weeks.      Relevant Medications   losartan (COZAAR) 50 MG tablet   Other Relevant Orders   US AORTA DUPLEX LIMITED   Ambulatory referral to Cardiology   Murmur    Observed by his ophthalmologist and ECHO recommended. Unable to appreciate murmur on exam today but will order ECHO for evaluation.      Relevant Orders   ECHOCARDIOGRAM COMPLETE   Ambulatory referral to Cardiology   Right inguinal hernia    He is interested in speaking with general surgeon, referral placed.      Relevant  Orders   Ambulatory referral to General Surgery   Irregular heart rate    NSR with PVC on EKG today      Relevant Orders  EKG 12-Lead (Completed)    Return in about 2 weeks (around 05/23/2022) for BP follow-up.   Park Meo, FNP

## 2022-05-09 NOTE — Assessment & Plan Note (Addendum)
Chronic, found during ED visit in 2020, 3.7cm, recommended 2y follow-up with [place] not done. Will order [place] and cardiology referral. BP remained elevated in office, he does not check at home. Elevated at prior visit. Start Losartan  daily and follow up in 2 weeks.

## 2022-05-09 NOTE — Assessment & Plan Note (Signed)
NSR with PVC on EKG today

## 2022-05-09 NOTE — Assessment & Plan Note (Signed)
Observed by his ophthalmologist and ECHO recommended. Unable to appreciate murmur on exam today but will order ECHO for evaluation.

## 2022-05-09 NOTE — Assessment & Plan Note (Signed)
Start Losartan  daily and follow up in 2 weeks. Instructed to seek medical care for chest pain, shortness of breath, lightheadedness/dizziness, vision changes, swelling of extremities, or recurrent headaches. Strongly encouraged to obtain BP cuff for home monitoring and report to office if sustained >180/120 or <110/60.

## 2022-05-10 LAB — CBC WITH DIFFERENTIAL/PLATELET
Absolute Monocytes: 730 cells/uL (ref 200–950)
Basophils Absolute: 73 cells/uL (ref 0–200)
Basophils Relative: 1 %
Eosinophils Absolute: 131 cells/uL (ref 15–500)
Eosinophils Relative: 1.8 %
HCT: 48 % (ref 38.5–50.0)
Hemoglobin: 16.3 g/dL (ref 13.2–17.1)
Lymphs Abs: 1555 cells/uL (ref 850–3900)
MCH: 31.1 pg (ref 27.0–33.0)
MCHC: 34 g/dL (ref 32.0–36.0)
MCV: 91.6 fL (ref 80.0–100.0)
MPV: 10.3 fL (ref 7.5–12.5)
Monocytes Relative: 10 %
Neutro Abs: 4811 cells/uL (ref 1500–7800)
Neutrophils Relative %: 65.9 %
Platelets: 213 10*3/uL (ref 140–400)
RBC: 5.24 10*6/uL (ref 4.20–5.80)
RDW: 12.7 % (ref 11.0–15.0)
Total Lymphocyte: 21.3 %
WBC: 7.3 10*3/uL (ref 3.8–10.8)

## 2022-05-10 LAB — COMPLETE METABOLIC PANEL WITH GFR
AG Ratio: 1.8 (calc) (ref 1.0–2.5)
ALT: 15 U/L (ref 9–46)
AST: 37 U/L — ABNORMAL HIGH (ref 10–35)
Albumin: 4.3 g/dL (ref 3.6–5.1)
Alkaline phosphatase (APISO): 77 U/L (ref 35–144)
BUN: 12 mg/dL (ref 7–25)
CO2: 29 mmol/L (ref 20–32)
Calcium: 9.6 mg/dL (ref 8.6–10.3)
Chloride: 102 mmol/L (ref 98–110)
Creat: 1.05 mg/dL (ref 0.70–1.22)
Globulin: 2.4 g/dL (calc) (ref 1.9–3.7)
Glucose, Bld: 88 mg/dL (ref 65–99)
Potassium: 5.3 mmol/L (ref 3.5–5.3)
Sodium: 138 mmol/L (ref 135–146)
Total Bilirubin: 1.4 mg/dL — ABNORMAL HIGH (ref 0.2–1.2)
Total Protein: 6.7 g/dL (ref 6.1–8.1)
eGFR: 70 mL/min/{1.73_m2} (ref >=60)

## 2022-05-10 LAB — LIPID PANEL
Cholesterol: 193 mg/dL (ref <200)
HDL: 47 mg/dL (ref >=40)
LDL Cholesterol (Calc): 128 mg/dL (calc) — ABNORMAL HIGH
Non-HDL Cholesterol (Calc): 146 mg/dL (calc) — ABNORMAL HIGH (ref <130)
Total CHOL/HDL Ratio: 4.1 (calc) (ref <5.0)
Triglycerides: 79 mg/dL (ref <150)

## 2022-05-23 ENCOUNTER — Ambulatory Visit: Payer: Medicare PPO | Admitting: Family Medicine

## 2022-05-26 ENCOUNTER — Encounter: Payer: Self-pay | Admitting: Family Medicine

## 2022-05-26 ENCOUNTER — Ambulatory Visit (INDEPENDENT_AMBULATORY_CARE_PROVIDER_SITE_OTHER): Payer: Medicare PPO | Admitting: Family Medicine

## 2022-05-26 VITALS — BP 132/82 | HR 67 | Temp 97.6°F | Ht 68.0 in | Wt 179.0 lb

## 2022-05-26 DIAGNOSIS — I10 Essential (primary) hypertension: Secondary | ICD-10-CM | POA: Insufficient documentation

## 2022-05-26 DIAGNOSIS — Z0000 Encounter for general adult medical examination without abnormal findings: Secondary | ICD-10-CM

## 2022-05-26 NOTE — Assessment & Plan Note (Signed)
At goal <130/80 on Losartan 50mg  daily. CMP today. Upcoming appointment with Cardiology next month for AAA follow-up. Continue heart healthy diet and exercise as tolerated. Seek medical care for chest pain, palpitations, shortness of breath, recurrent headaches, vision changes. Notify office if BP sustains >140/90. Follow up in 3 months.

## 2022-05-26 NOTE — Addendum Note (Signed)
Addended by: Bernadette Gores S on: 05/26/2022 11:17 AM   Modules accepted: Level of Service  

## 2022-05-26 NOTE — Progress Notes (Signed)
   Acute Office Visit  Subjective:     Patient ID: Jose Villarreal, male    DOB: 1940/02/16, 82 y.o.   MRN: [id number]  No chief complaint on file.   HPI Patient is in today for blood pressure follow up. At his initial visit with me his BP was 164/80. He was started on Losartan 50mg  daily. His home readings since have been 105-135/75-92 with average <130/80. He does have a cardiology appointment for his AAA check with Dr Jenell Milliner on June 5.   HYPERTENSION without Chronic Kidney Disease Hypertension status: controlled  Satisfied with current treatment? yes Duration of hypertension:  unknown BP monitoring frequency:  a few times a day BP range: <130/80  BP medication side effects:  no Medication compliance: excellent compliance Previous BP meds:losartan (cozaar) Aspirin: no Recurrent headaches: no Visual changes: no Palpitations: no Dyspnea: no Chest pain: no Lower extremity edema: no Dizzy/lightheaded: no   Review of Systems  All other systems reviewed and are negative.       Objective:    BP 132/82   Pulse 67   Temp 97.6 F (36.4 C) (Oral)   Ht 5\' 8"  (1.727 m)   Wt 179 lb (81.2 kg)   SpO2 98%   BMI 27.22 kg/m  BP Readings from Last 3 Encounters:  05/26/22 132/82  05/09/22 (!) 164/80  12/04/18 (!) 151/84      Physical Exam Vitals and nursing note reviewed.  Constitutional:      Appearance: Normal appearance. He is normal weight.  HENT:     Head: Normocephalic and atraumatic.  Cardiovascular:     Rate and Rhythm: Normal rate. Rhythm irregular.     Pulses: Normal pulses.     Heart sounds: Normal heart sounds.  Pulmonary:     Effort: Pulmonary effort is normal.     Breath sounds: Normal breath sounds.  Skin:    General: Skin is warm and dry.     Capillary Refill: Capillary refill takes less than 2 seconds.  Neurological:     General: No focal deficit present.     Mental Status: He is alert and oriented to person, place, and time. Mental status  is at baseline.  Psychiatric:        Mood and Affect: Mood normal.        Behavior: Behavior normal.        Thought Content: Thought content normal.        Judgment: Judgment normal.     No results found for any visits on 05/26/22.      Assessment & Plan:   Problem List Items Addressed This Visit     Primary hypertension - Primary    At goal <130/80 on Losartan 50mg  daily. CMP today. Upcoming appointment with Cardiology next month for AAA follow-up. Continue heart healthy diet and exercise as tolerated. Seek medical care for chest pain, palpitations, shortness of breath, recurrent headaches, vision changes. Notify office if BP sustains >140/90. Follow up in 3 months.      Relevant Orders   COMPLETE METABOLIC PANEL WITH GFR    No orders of the defined types were placed in this encounter.   Return in about 3 months (around 08/26/2022) for BP.  Park Meo, FNP

## 2022-05-26 NOTE — Patient Instructions (Signed)
Fat and Cholesterol Restricted Eating Plan Eating a diet that limits fat and cholesterol may help lower your risk for heart disease and other conditions. Your body needs fat and cholesterol for basic functions, but eating too much of these things can be harmful to your health. Your health care provider may order lab tests to check your blood fat (lipid) and cholesterol levels. This helps your health care provider understand your risk for certain conditions and whether you need to make diet changes. Work with your health care provider or dietitian to make an eating plan that is right for you. Your plan includes: Limit your fat intake to ______% or less of your total calories a day. This is ______g of fat per day. Limit your saturated fat intake to ______% or less of your total calories a day. This is ______g of saturated fat per day. Limit the amount of cholesterol in your diet to less than _________mg a day. Eat ___________ g of fiber a day. What are tips for following this plan? General guidelines If you are overweight, work with your health care provider to lose weight safely. Losing just 5-10% of your body weight can improve your overall health and help prevent diseases such as diabetes and heart disease. Avoid: Foods with added sugar. Fried foods. Foods that contain partially hydrogenated oils, including stick margarine, some tub margarines, cookies, crackers, and other baked goods. If you drink alcohol: Limit how much you have to: 0-1 drink a day for women who are not pregnant. 0-2 drinks a day for men. Know how much alcohol is in a drink. In the [place] one drink equals one 12 oz bottle of beer (355 mL), one 5 oz glass of wine (148 mL), or one 1 oz glass of hard liquor (44 mL). Reading food labels Check food labels for: Trans fats or partially hydrogenated oils. Avoid foods that contain these. High amounts of saturated fat. Choose foods that are low in saturated fat (less than 2 g). The  amount of cholesterol in each serving. The amount of fiber in each serving. Choose foods with healthy fats, such as: Monounsaturated and polyunsaturated fats. These include olive and canola oil, flaxseeds, walnuts, almonds, and seeds. Omega-3 fats. These are found in foods such as salmon, mackerel, sardines, tuna, flaxseed oil, and ground flaxseeds. Choose grain products that have whole grains. Look for the word "whole" as the first word in the ingredient list. Cooking Cook foods using methods other than frying. Baking, boiling, grilling, and broiling are some healthy options. Eat more home-cooked food and less restaurant, buffet, and fast food. Avoid cooking using saturated fats. Animal sources of saturated fats include meats, butter, and cream. Plant sources of saturated fats include palm oil, palm kernel oil, and coconut oil. Meal planning  At meals, imagine dividing your plate into fourths: Fill one-half of your plate with vegetables, green salads, and fruit. Fill one-fourth of your plate with whole grains. Fill one-fourth of your plate with lean protein foods. Eat fish that is high in omega-3 fats at least two times a week. Eat more foods that contain fiber, such as whole grains, beans, apples, pears, berries, broccoli, carrots, peas, and barley. These foods help promote healthy cholesterol levels in the blood. What foods should I eat? Fruits All fresh, canned (in natural juice), or frozen fruits. Vegetables Fresh or frozen vegetables (raw, steamed, roasted, or grilled). Green salads. Grains Whole grains, such as whole wheat or whole grain breads, crackers, cereals, and pasta. Unsweetened oatmeal, bulgur,   barley, quinoa, or brown rice. Corn or whole wheat flour tortillas. Meats and other proteins Ground beef (85% or leaner), grass-fed beef, or beef trimmed of fat. Skinless chicken or [place]. Ground chicken or [place]. Pork trimmed of fat. All fish and seafood. Egg whites. Dried beans,  peas, or lentils. Unsalted nuts or seeds. Unsalted canned beans. Natural nut butters without added sugar and oil. Dairy Low-fat or nonfat dairy products, such as skim or 1% milk, 2% or reduced-fat cheeses, low-fat and fat-free ricotta or cottage cheese, or plain low-fat and nonfat yogurt. Fats and oils Tub margarine without trans fats. Light or reduced-fat mayonnaise and salad dressings. Avocado. Olive, canola, sesame, or safflower oils. The items listed above may not be a complete list of foods and beverages you can eat. Contact a dietitian for more information. What foods should I avoid? Fruits Canned fruit in heavy syrup. Fruit in cream or butter sauce. Fried fruit. Vegetables Vegetables cooked in cheese, cream, or butter sauce. Fried vegetables. Grains White bread. White pasta. White rice. Cornbread. Bagels, pastries, and croissants. Crackers and snack foods that contain trans fat and hydrogenated oils. Meats and other proteins Fatty cuts of meat. Ribs, chicken wings, bacon, sausage, bologna, salami, chitterlings, fatback, hot dogs, bratwurst, and packaged lunch meats. Liver and organ meats. Whole eggs and egg yolks. Chicken and [place] with skin. Fried meat. Dairy Whole or 2% milk, cream, half-and-half, and cream cheese. Whole milk cheeses. Whole-fat or sweetened yogurt. Full-fat cheeses. Nondairy creamers and whipped toppings. Processed cheese, cheese spreads, and cheese curds. Fats and oils Butter, stick margarine, lard, shortening, ghee, or bacon fat. Coconut, palm kernel, and palm oils. Beverages Alcohol. Sugar-sweetened drinks such as sodas, lemonade, and fruit drinks. Sweets and desserts Corn syrup, sugars, honey, and molasses. Candy. Jam and jelly. Syrup. Sweetened cereals. Cookies, pies, cakes, donuts, muffins, and ice cream. The items listed above may not be a complete list of foods and beverages you should avoid. Contact a dietitian for more information. Summary Your body needs  fat and cholesterol for basic functions. However, eating too much of these things can be harmful to your health. Work with your health care provider and dietitian to follow a diet that limits fat and cholesterol. Doing this may help lower your risk for heart disease and other conditions. Choose healthy fats, such as monounsaturated and polyunsaturated fats, and foods high in omega-3 fatty acids. Eat fiber-rich foods, such as whole grains, beans, peas, fruits, and vegetables. Limit or avoid alcohol, fried foods, and foods high in saturated fats, partially hydrogenated oils, and sugar. This information is not intended to replace advice given to you by your health care provider. Make sure you discuss any questions you have with your health care provider. Document Revised: 05/15/2020 Document Reviewed: 05/15/2020 Elsevier Patient Education  2023 [employer].  Fall Prevention in the Home, Adult Falls can cause injuries and can happen to people of all ages. There are many things you can do to make your home safer and to help prevent falls. What actions can I take to prevent falls? General information Use good lighting in all rooms. Make sure to: Replace any light bulbs that burn out. Turn on the lights in dark areas and use night-lights. Keep items that you use often in easy-to-reach places. Lower the shelves around your home if needed. Move furniture so that there are clear paths around it. Do not use throw rugs or other things on the floor that can make you trip. If any of your  floors are uneven, fix them. Add color or contrast paint or tape to clearly mark and help you see: Grab bars or handrails. First and last steps of staircases. Where the edge of each step is. If you use a ladder or stepladder: Make sure that it is fully opened. Do not climb a closed ladder. Make sure the sides of the ladder are locked in place. Have someone hold the ladder while you use it. Know where your pets are as  you move through your home. What can I do in the bathroom?     Keep the floor dry. Clean up any water on the floor right away. Remove soap buildup in the bathtub or shower. Buildup makes bathtubs and showers slippery. Use non-skid mats or decals on the floor of the bathtub or shower. Attach bath mats securely with double-sided, non-slip rug tape. If you need to sit down in the shower, use a non-slip stool. Install grab bars by the toilet and in the bathtub and shower. Do not use towel bars as grab bars. What can I do in the bedroom? Make sure that you have a light by your bed that is easy to reach. Do not use any sheets or blankets on your bed that hang to the floor. Have a firm chair or bench with side arms that you can use for support when you get dressed. What can I do in the kitchen? Clean up any spills right away. If you need to reach something above you, use a step stool with a grab bar. Keep electrical cords out of the way. Do not use floor polish or wax that makes floors slippery. What can I do with my stairs? Do not leave anything on the stairs. Make sure that you have a light switch at the top and the bottom of the stairs. Make sure that there are handrails on both sides of the stairs. Fix handrails that are broken or loose. Install non-slip stair treads on all your stairs if they do not have carpet. Avoid having throw rugs at the top or bottom of the stairs. Choose a carpet that does not hide the edge of the steps on the stairs. Make sure that the carpet is firmly attached to the stairs. Fix carpet that is loose or worn. What can I do on the outside of my home? Use bright outdoor lighting. Fix the edges of walkways and driveways and fix any cracks. Clear paths of anything that can make you trip, such as tools or rocks. Add color or contrast paint or tape to clearly mark and help you see anything that might make you trip as you walk through a door, such as a raised step or  threshold. Trim any bushes or trees on paths to your home. Check to see if handrails are loose or broken and that both sides of all steps have handrails. Install guardrails along the edges of any raised decks and porches. Have leaves, snow, or ice cleared regularly. Use sand, salt, or ice melter on paths if you live where there is ice and snow during the winter. Clean up any spills in your garage right away. This includes grease or oil spills. What other actions can I take? Review your medicines with your doctor. Some medicines can cause dizziness or changes in blood pressure, which increase your risk of falling. Wear shoes that: Have a low heel. Do not wear high heels. Have rubber bottoms and are closed at the toe. Feel good on your  feet and fit well. Use tools that help you move around if needed. These include: Canes. Walkers. Scooters. Crutches. Ask your doctor what else you can do to help prevent falls. This may include seeing a physical therapist to learn to do exercises to move better and get stronger. Where to find more information Centers for Disease Control and Prevention, [employer]: TonerPromos.no [employer] on Aging: BaseRingTones.pl National Institute on Aging: BaseRingTones.pl Contact a doctor if: You are afraid of falling at home. You feel weak, drowsy, or dizzy at home. You fall at home. Get help right away if you: Lose consciousness or have trouble moving after a fall. Have a fall that causes a head injury. These symptoms may be an emergency. Get help right away. Call 911. Do not wait to see if the symptoms will go away. Do not drive yourself to the hospital. This information is not intended to replace advice given to you by your health care provider. Make sure you discuss any questions you have with your health care provider. Document Revised: 09/06/2021 Document Reviewed: 09/06/2021 Elsevier Patient Education  2023 [employer]  Health Maintenance, Male Adopting a healthy  lifestyle and getting preventive care are important in promoting health and wellness. Ask your health care provider about: The right schedule for you to have regular tests and exams. Things you can do on your own to prevent diseases and keep yourself healthy. What should I know about diet, weight, and exercise? Eat a healthy diet  Eat a diet that includes plenty of vegetables, fruits, low-fat dairy products, and lean protein. Do not eat a lot of foods that are high in solid fats, added sugars, or sodium. Maintain a healthy weight Body mass index (BMI) is a measurement that can be used to identify possible weight problems. It estimates body fat based on height and weight. Your health care provider can help determine your BMI and help you achieve or maintain a healthy weight. Get regular exercise Get regular exercise. This is one of the most important things you can do for your health. Most adults should: Exercise for at least 150 minutes each week. The exercise should increase your heart rate and make you sweat (moderate-intensity exercise). Do strengthening exercises at least twice a week. This is in addition to the moderate-intensity exercise. Spend less time sitting. Even light physical activity can be beneficial. Watch cholesterol and blood lipids Have your blood tested for lipids and cholesterol at 84 years of age, then have this test every 5 years. You may need to have your cholesterol levels checked more often if: Your lipid or cholesterol levels are high. You are older than 84 years of age. You are at high risk for heart disease. What should I know about cancer screening? Many types of cancers can be detected early and may often be prevented. Depending on your health history and family history, you may need to have cancer screening at various ages. This may include screening for: Colorectal cancer. Prostate cancer. Skin cancer. Lung cancer. What should I know about heart disease,  diabetes, and high blood pressure? Blood pressure and heart disease High blood pressure causes heart disease and increases the risk of stroke. This is more likely to develop in people who have high blood pressure readings or are overweight. Talk with your health care provider about your target blood pressure readings. Have your blood pressure checked: Every 3-5 years if you are 25-75 years of age. Every year if you are 23 years old or older.  If you are between the ages of 55 and 82 and are a current or former smoker, ask your health care provider if you should have a one-time screening for abdominal aortic aneurysm (AAA). Diabetes Have regular diabetes screenings. This checks your fasting blood sugar level. Have the screening done: Once every three years after age 13 if you are at a normal weight and have a low risk for diabetes. More often and at a younger age if you are overweight or have a high risk for diabetes. What should I know about preventing infection? Hepatitis B If you have a higher risk for hepatitis B, you should be screened for this virus. Talk with your health care provider to find out if you are at risk for hepatitis B infection. Hepatitis C Blood testing is recommended for: Everyone born from [birth year] through 1967. Anyone with known risk factors for hepatitis C. Sexually transmitted infections (STIs) You should be screened each year for STIs, including gonorrhea and chlamydia, if: You are sexually active and are younger than 84 years of age. You are older than 84 years of age and your health care provider tells you that you are at risk for this type of infection. Your sexual activity has changed since you were last screened, and you are at increased risk for chlamydia or gonorrhea. Ask your health care provider if you are at risk. Ask your health care provider about whether you are at high risk for HIV. Your health care provider may recommend a prescription medicine to help  prevent HIV infection. If you choose to take medicine to prevent HIV, you should first get tested for HIV. You should then be tested every 3 months for as long as you are taking the medicine. Follow these instructions at home: Alcohol use Do not drink alcohol if your health care provider tells you not to drink. If you drink alcohol: Limit how much you have to 0-2 drinks a day. Know how much alcohol is in your drink. In the [place] one drink equals one 12 oz bottle of beer (355 mL), one 5 oz glass of wine (148 mL), or one 1 oz glass of hard liquor (44 mL). Lifestyle Do not use any products that contain nicotine or tobacco. These products include cigarettes, chewing tobacco, and vaping devices, such as e-cigarettes. If you need help quitting, ask your health care provider. Do not use street drugs. Do not share needles. Ask your health care provider for help if you need support or information about quitting drugs. General instructions Schedule regular health, dental, and eye exams. Stay current with your vaccines. Tell your health care provider if: You often feel depressed. You have ever been abused or do not feel safe at home. Summary Adopting a healthy lifestyle and getting preventive care are important in promoting health and wellness. Follow your health care provider's instructions about healthy diet, exercising, and getting tested or screened for diseases. Follow your health care provider's instructions on monitoring your cholesterol and blood pressure. This information is not intended to replace advice given to you by your health care provider. Make sure you discuss any questions you have with your health care provider. Document Revised: 05/25/2020 Document Reviewed: 05/25/2020 Elsevier Patient Education  2023 [employer].

## 2022-05-26 NOTE — Progress Notes (Signed)
Subjective:   Jose Villarreal is a 82 y.o. male who presents for Medicare Annual/Subsequent preventive examination.  Review of Systems     Cardiac Risk Factors include: advanced age (>14men, >33 women);hypertension;male gender     Objective:    Today's Vitals   05/26/22 1042  BP: 132/82  Pulse: 67  Temp: 97.6 F (36.4 C)  SpO2: 97%  Weight: 179 lb (81.2 kg)  Height: 5\' 8"  (1.727 m)   Body mass index is 27.22 kg/m.     05/26/2022   10:35 AM  Advanced Directives  Does Patient Have a Medical Advance Directive? Yes  Type of Estate agent of [place];Living will    Current Medications (verified) Outpatient Encounter Medications as of 05/26/2022  Medication Sig   losartan (COZAAR) 50 MG tablet Take 1 tablet (50 mg total) by mouth daily.   No facility-administered encounter medications on file as of 05/26/2022.    Allergies (verified) Alpha-gal   History: No past medical history on file. Past Surgical History:  Procedure Laterality Date   EYE SURGERY     Family History  Problem Relation Age of Onset   Parkinson's disease Mother    Heart failure Father    Social History   Socioeconomic History   Marital status: Married    Spouse name: Not on file   Number of children: Not on file   Years of education: Not on file   Highest education level: Not on file  Occupational History   Not on file  Tobacco Use   Smoking status: Never   Smokeless tobacco: Never  Vaping Use   Vaping Use: Never used  Substance and Sexual Activity   Alcohol use: Yes   Drug use: No   Sexual activity: Not on file  Other Topics Concern   Not on file  Social History Narrative   Not on file   Social Determinants of Health   Financial Resource Strain: Low Risk  (05/26/2022)   Overall Financial Resource Strain (CARDIA)    Difficulty of Paying Living Expenses: Not hard at all  Food Insecurity: No Food Insecurity (05/26/2022)   Hunger Vital Sign    Worried About  Running Out of Food in the Last Year: Never true    Ran Out of Food in the Last Year: Never true  Transportation Needs: No Transportation Needs (05/26/2022)   PRAPARE - Administrator, Civil Service (Medical): No    Lack of Transportation (Non-Medical): No  Physical Activity: Sufficiently Active (05/26/2022)   Exercise Vital Sign    Days of Exercise per Week: 7 days    Minutes of Exercise per Session: 40 min  Stress: No Stress Concern Present (05/26/2022)   [employer] of Occupational Health - Occupational Stress Questionnaire    Feeling of Stress : Not at all  Social Connections: Moderately Isolated (05/26/2022)   Social Connection and Isolation Panel [NHANES]    Frequency of Communication with Friends and Family: More than three times a week    Frequency of Social Gatherings with Friends and Family: More than three times a week    Attends Religious Services: Never    Database administrator or Organizations: No    Attends Engineer, structural: Never    Marital Status: Married    Tobacco Counseling Counseling given: Not Answered   Clinical Intake:  Pre-visit preparation completed: Yes  Pain : No/denies pain     Nutritional Risks: None Diabetes: No  How often  do you need to have someone help you when you read instructions, pamphlets, or other written materials from your doctor or pharmacy?: 1 - Never  Diabetic?no  Interpreter Needed?: No      Activities of Daily Living    05/26/2022   10:36 AM  In your present state of health, do you have any difficulty performing the following activities:  Hearing? 0  Vision? 0  Difficulty concentrating or making decisions? 0  Walking or climbing stairs? 0  Dressing or bathing? 0  Doing errands, shopping? 0  Preparing Food and eating ? N  Using the Toilet? N  In the past six months, have you accidently leaked urine? N  Do you have problems with loss of bowel control? N  Managing your Medications? N   Managing your Finances? N  Housekeeping or managing your Housekeeping? N    Patient Care Team: Park Meo, FNP as PCP - General (Family Medicine)  Indicate any recent Medical Services you may have received from other than [facility] providers in the past year (date may be approximate).     Assessment:   This is a routine wellness examination for [place].  Hearing/Vision screen No results found.  Dietary issues and exercise activities discussed: Current Exercise Habits: Home exercise routine, Type of exercise: walking, Time (Minutes): 40, Frequency (Times/Week): >7, Weekly Exercise (Minutes/Week): 0, Intensity: Mild, Exercise limited by: None identified   Goals Addressed             This Visit's Progress    DIET - REDUCE FAST FOOD INTAKE       Exercise 3x per week (30 min per time)         Depression Screen    05/26/2022   10:35 AM 05/09/2022   10:39 AM 11/21/2018   10:02 AM  PHQ 2/9 Scores  PHQ - 2 Score 0 0 0  PHQ- 9 Score 0 0     Fall Risk    05/26/2022   10:35 AM 05/09/2022   10:39 AM 11/21/2018   10:02 AM  Fall Risk   Falls in the past year? 0 0 0  Number falls in past yr:  0 0  Injury with Fall?  0 0    FALL RISK PREVENTION PERTAINING TO THE HOME:  Any stairs in or around the home? Yes  If so, are there any without handrails? No  Home free of loose throw rugs in walkways, pet beds, electrical cords, etc? Yes  Adequate lighting in your home to reduce risk of falls? Yes   ASSISTIVE DEVICES UTILIZED TO PREVENT FALLS:  Life alert? No  Use of a cane, walker or w/c? No  Grab bars in the bathroom? Yes  Shower chair or bench in shower? No  Elevated toilet seat or a handicapped toilet? No   TIMED UP AND GO:  Was the test performed? Yes .  Length of time to ambulate 10 feet: 10 sec.   Gait steady and fast without use of assistive device  Cognitive Function:        05/26/2022   10:37 AM  6CIT Screen  What Year? 0 points  What month? 0 points  What  time? 0 points  Count back from 20 0 points  Months in reverse 0 points  Repeat phrase 0 points  Total Score 0 points    Immunizations Immunization History  Administered Date(s) Administered   Influenza-Unspecified 01/18/1988   Td 01/18/1988    TDAP status: Due, Education has been provided  regarding the importance of this vaccine. Advised may receive this vaccine at local pharmacy or Health Dept. Aware to provide a copy of the vaccination record if obtained from local pharmacy or Health Dept. Verbalized acceptance and understanding.  Flu Vaccine status: Declined, Education has been provided regarding the importance of this vaccine but patient still declined. Advised may receive this vaccine at local pharmacy or Health Dept. Aware to provide a copy of the vaccination record if obtained from local pharmacy or Health Dept. Verbalized acceptance and understanding.  Pneumococcal vaccine status: Declined,  Education has been provided regarding the importance of this vaccine but patient still declined. Advised may receive this vaccine at local pharmacy or Health Dept. Aware to provide a copy of the vaccination record if obtained from local pharmacy or Health Dept. Verbalized acceptance and understanding.   Covid-19 vaccine status: Declined, Education has been provided regarding the importance of this vaccine but patient still declined. Advised may receive this vaccine at local pharmacy or Health Dept.or vaccine clinic. Aware to provide a copy of the vaccination record if obtained from local pharmacy or Health Dept. Verbalized acceptance and understanding.  Qualifies for Shingles Vaccine? Yes   Zostavax completed No   Shingrix Completed?: No.    Education has been provided regarding the importance of this vaccine. Patient has been advised to call insurance company to determine out of pocket expense if they have not yet received this vaccine. Advised may also receive vaccine at local pharmacy or  Health Dept. Verbalized acceptance and understanding.  Screening Tests Health Maintenance  Topic Date Due   COVID-19 Vaccine (1) Never done   Zoster Vaccines- Shingrix (1 of 2) 08/08/2022 (Originally 04/18/1988)   Pneumonia Vaccine 85+ Years old (1 of 1 - PCV) 05/09/2023 (Originally 04/19/2003)   INFLUENZA VACCINE  08/18/2022   Medicare Annual Wellness (AWV)  05/26/2023   HPV VACCINES  Aged Out   DTaP/Tdap/Td  Discontinued    Health Maintenance  Health Maintenance Due  Topic Date Due   COVID-19 Vaccine (1) Never done    Colorectal cancer screening: No longer required.   Lung Cancer Screening: (Low Dose CT Chest recommended if Age 43-80 years, 30 pack-year currently smoking OR have quit w/in 15years.) does not qualify.   Lung Cancer Screening Referral: no  Additional Screening:  Hepatitis C Screening: does not qualify; Completed n/a  Vision Screening: Recommended annual ophthalmology exams for early detection of glaucoma and other disorders of the eye. Is the patient up to date with their annual eye exam?  Yes  Who is the provider or what is the name of the office in which the patient attends annual eye exams? Dr Nada Libman If pt is not established with a provider, would they like to be referred to a provider to establish care? No .   Dental Screening: Recommended annual dental exams for proper oral hygiene  Community Resource Referral / Chronic Care Management: CRR required this visit?  No   CCM required this visit?  No      Plan:     I have personally reviewed and noted the following in the patient's chart:   Medical and social history Use of alcohol, tobacco or illicit drugs  Current medications and supplements including opioid prescriptions. Patient is not currently taking opioid prescriptions. Functional ability and status Nutritional status Physical activity Advanced directives List of other physicians Hospitalizations, surgeries, and ER visits in previous 12  months Vitals Screenings to include cognitive, depression, and falls Referrals and appointments  In addition, I have reviewed and discussed with patient certain preventive protocols, quality metrics, and best practice recommendations. A written personalized care plan for preventive services as well as general preventive health recommendations were provided to patient.     Park Meo, [place]   05/26/2022   Nurse Notes: none

## 2022-05-27 LAB — COMPLETE METABOLIC PANEL WITH GFR
AG Ratio: 1.8 (calc) (ref 1.0–2.5)
ALT: 12 U/L (ref 9–46)
AST: 34 U/L (ref 10–35)
Albumin: 4 g/dL (ref 3.6–5.1)
Alkaline phosphatase (APISO): 75 U/L (ref 35–144)
BUN: 13 mg/dL (ref 7–25)
CO2: 25 mmol/L (ref 20–32)
Calcium: 9 mg/dL (ref 8.6–10.3)
Chloride: 99 mmol/L (ref 98–110)
Creat: 1.08 mg/dL (ref 0.70–1.22)
Globulin: 2.2 g/dL (calc) (ref 1.9–3.7)
Glucose, Bld: 74 mg/dL (ref 65–99)
Potassium: 4.6 mmol/L (ref 3.5–5.3)
Sodium: 134 mmol/L — ABNORMAL LOW (ref 135–146)
Total Bilirubin: 1.5 mg/dL — ABNORMAL HIGH (ref 0.2–1.2)
Total Protein: 6.2 g/dL (ref 6.1–8.1)
eGFR: 68 mL/min/{1.73_m2} (ref >=60)

## 2022-06-22 ENCOUNTER — Ambulatory Visit: Payer: Medicare PPO | Admitting: Internal Medicine

## 2022-08-25 ENCOUNTER — Ambulatory Visit: Payer: Medicare PPO | Admitting: Family Medicine

## 2022-08-31 ENCOUNTER — Ambulatory Visit: Payer: Medicare PPO | Admitting: Orthopedic Surgery

## 2022-08-31 ENCOUNTER — Other Ambulatory Visit: Payer: Self-pay

## 2022-08-31 DIAGNOSIS — M25561 Pain in right knee: Secondary | ICD-10-CM

## 2022-08-31 DIAGNOSIS — M1711 Unilateral primary osteoarthritis, right knee: Secondary | ICD-10-CM | POA: Diagnosis not present

## 2022-08-31 DIAGNOSIS — M170 Bilateral primary osteoarthritis of knee: Secondary | ICD-10-CM

## 2022-08-31 NOTE — Progress Notes (Unsigned)
Office Visit Note   Patient: Jose Villarreal           Date of Birth: [birth year]           MRN: [id number] Visit Date: 08/31/2022 Requested by: No referring provider defined for this encounter. PCP: Park Meo, FNP  Subjective: Chief Complaint  Patient presents with   Right Knee - Follow-up    HPI: Jose Villarreal is a 82 y.o. male who presents to the office reporting right knee pain.  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  Did have aspiration and injection of that right knee in the Baker's cyst on 02/28/2022.  Did get some relief for about a month from that intervention.  If he walks he does tries to be careful.  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  States that his knee gets better with rest.  No falls since last office visit.  Since he was last seen he has started on losartan with improvement in blood pressure.  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  Pushing a cart in [employer] is not a problem.  He can also move his trash cans about 100 to 200 yards.  Does not take any medication for the knees.  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                ROS: All systems reviewed are negative as they relate to the chief complaint within the history of present illness.  Patient denies fevers or chills.  Assessment & Plan: Visit Diagnoses:  1. Right knee pain, unspecified chronicity     Plan: Impression is radiographically severe arthritis but with clinically a continually manageable situation for Jose Villarreal.  He would like to have the knee aspirated and injected today along with the Baker's cyst aspirated.  Both of those are performed today.  We did aspirate about 25 cc from the multiloculated Baker's cyst in the back of the knee.  Will see how he does with that intervention.  Follow-up as needed.  Follow-Up Instructions: No follow-ups on file.   Orders:  Orders Placed This Encounter  Procedures   US  Guided Needle Placement - No Linked Charges   No orders of the defined types were placed in this encounter.     Procedures: Large Joint Inj: R knee on 08/31/2022 4:00 PM Indications: diagnostic evaluation, joint swelling and pain Details: 18 G 1.5 in needle, superolateral approach  Arthrogram: No  Medications: 5 mL lidocaine 1 %; 40 mg methylPREDNISolone acetate 40 MG/ML; 4 mL bupivacaine 0.25 % Outcome: tolerated well, no immediate complications Procedure, treatment alternatives, risks and benefits explained, specific risks discussed. Consent was given by the patient. Immediately prior to procedure a time out was called to verify the correct patient, procedure, equipment, support staff and site/side marked as required. Patient was prepped and draped in the usual sterile fashion.       Clinical Data: No additional findings.  Objective: Vital Signs: There were no vitals taken for this visit.  Physical Exam:  Constitutional: Patient appears well-developed HEENT:  Head: Normocephalic Eyes:EOM are normal Neck: Normal range of motion Cardiovascular: Normal rate Pulmonary/chest: Effort normal Neurologic: Patient is alert Skin: Skin is warm Psychiatric: Patient has normal mood and affect  Ortho Exam: Ortho exam demonstrates mild varus alignment of that right knee.  Does have about a 10 degree flexion contracture but can bend  past 90.  Extensor mechanism intact.  No groin pain with internal/external rotation of the right leg.  Pedal pulses palpable.  Specialty Comments:  No specialty comments available.  Imaging: US Guided Needle Placement - No Linked Charges  Result Date: 08/31/2022 Ultrasound imaging demonstrates needle placement into Baker's cyst with decompression of multilobulated cystic structure consistent with Baker's cyst and decompression of that cyst to a significant degree.    PMFS History: Patient Active Problem List   Diagnosis Date Noted   Primary  hypertension 05/26/2022   Elevated blood pressure reading in office without diagnosis of hypertension 05/09/2022   Abdominal aortic aneurysm (AAA) without rupture (HCC) 05/09/2022   Murmur 05/09/2022   Right inguinal hernia 05/09/2022   Irregular heart rate 05/09/2022   Myalgia 11/21/2018   Elevated heart rate with elevated blood pressure without diagnosis of hypertension 11/21/2018   Left arm pain 11/21/2018   Left shoulder pain 11/21/2018   No past medical history on file.  Family History  Problem Relation Age of Onset   Parkinson's disease Mother    Heart failure Father     Past Surgical History:  Procedure Laterality Date   EYE SURGERY     Social History   Occupational History   Not on file  Tobacco Use   Smoking status: Never   Smokeless tobacco: Never  Vaping Use   Vaping status: Never Used  Substance and Sexual Activity   Alcohol use: Yes   Drug use: No   Sexual activity: Not on file

## 2022-09-01 ENCOUNTER — Encounter: Payer: Self-pay | Admitting: Orthopedic Surgery

## 2022-09-01 MED ORDER — LIDOCAINE HCL 1 % IJ SOLN
5.0000 mL | INTRAMUSCULAR | Status: AC | PRN
Start: 2022-08-31 — End: 2022-08-31
  Administered 2022-08-31: 5 mL

## 2022-09-01 MED ORDER — METHYLPREDNISOLONE ACETATE 40 MG/ML IJ SUSP
40.0000 mg | INTRAMUSCULAR | Status: AC | PRN
Start: 2022-08-31 — End: 2022-08-31
  Administered 2022-08-31: 40 mg via INTRA_ARTICULAR

## 2022-09-01 MED ORDER — BUPIVACAINE HCL 0.25 % IJ SOLN
4.0000 mL | INTRAMUSCULAR | Status: AC | PRN
Start: 2022-08-31 — End: 2022-08-31
  Administered 2022-08-31: 4 mL via INTRA_ARTICULAR

## 2023-02-18 ENCOUNTER — Observation Stay (HOSPITAL_COMMUNITY)
Admission: EM | Admit: 2023-02-18 | Discharge: 2023-02-19 | Disposition: A | Discharge status: Home / Self Care | Payer: Medicare PPO | Attending: Internal Medicine | Admitting: Internal Medicine

## 2023-02-18 ENCOUNTER — Emergency Department (HOSPITAL_COMMUNITY): Payer: Medicare PPO

## 2023-02-18 ENCOUNTER — Other Ambulatory Visit: Payer: Self-pay

## 2023-02-18 DIAGNOSIS — R202 Paresthesia of skin: Secondary | ICD-10-CM | POA: Diagnosis present

## 2023-02-18 DIAGNOSIS — I10 Essential (primary) hypertension: Secondary | ICD-10-CM | POA: Diagnosis present

## 2023-02-18 DIAGNOSIS — G459 Transient cerebral ischemic attack, unspecified: Secondary | ICD-10-CM | POA: Diagnosis not present

## 2023-02-18 DIAGNOSIS — Z79899 Other long term (current) drug therapy: Secondary | ICD-10-CM | POA: Insufficient documentation

## 2023-02-18 DIAGNOSIS — F1090 Alcohol use, unspecified, uncomplicated: Secondary | ICD-10-CM | POA: Insufficient documentation

## 2023-02-18 DIAGNOSIS — R911 Solitary pulmonary nodule: Secondary | ICD-10-CM | POA: Insufficient documentation

## 2023-02-18 DIAGNOSIS — Z7901 Long term (current) use of anticoagulants: Secondary | ICD-10-CM | POA: Diagnosis not present

## 2023-02-18 DIAGNOSIS — I714 Abdominal aortic aneurysm, without rupture, unspecified: Secondary | ICD-10-CM

## 2023-02-18 LAB — URINALYSIS, ROUTINE W REFLEX MICROSCOPIC
Bilirubin Urine: NEGATIVE
Glucose, UA: NEGATIVE mg/dL
Hgb urine dipstick: NEGATIVE
Ketones, ur: NEGATIVE mg/dL
Leukocytes,Ua: NEGATIVE
Nitrite: NEGATIVE
Protein, ur: NEGATIVE mg/dL
Specific Gravity, Urine: 1.008 (ref 1.005–1.030)
pH: 7 (ref 5.0–8.0)

## 2023-02-18 LAB — CBC
HCT: 42.6 % (ref 39.0–52.0)
Hemoglobin: 14.2 g/dL (ref 13.0–17.0)
MCH: 31 pg (ref 26.0–34.0)
MCHC: 33.3 g/dL (ref 30.0–36.0)
MCV: 93 fL (ref 80.0–100.0)
Platelets: 182 10*3/uL (ref 150–400)
RBC: 4.58 MIL/uL (ref 4.22–5.81)
RDW: 12.3 % (ref 11.5–15.5)
WBC: 6.4 10*3/uL (ref 4.0–10.5)
nRBC: 0 % (ref 0.0–0.2)

## 2023-02-18 LAB — COMPREHENSIVE METABOLIC PANEL
ALT: 15 U/L (ref 0–44)
AST: 40 U/L (ref 15–41)
Albumin: 3.7 g/dL (ref 3.5–5.0)
Alkaline Phosphatase: 63 U/L (ref 38–126)
Anion gap: 9 (ref 5–15)
BUN: 11 mg/dL (ref 8–23)
CO2: 25 mmol/L (ref 22–32)
Calcium: 9 mg/dL (ref 8.9–10.3)
Chloride: 99 mmol/L (ref 98–111)
Creatinine, Ser: 1.01 mg/dL (ref 0.61–1.24)
GFR, Estimated: >60 mL/min (ref >60)
Glucose, Bld: 93 mg/dL (ref 70–99)
Potassium: 4.8 mmol/L (ref 3.5–5.1)
Sodium: 133 mmol/L — ABNORMAL LOW (ref 135–145)
Total Bilirubin: 1.2 mg/dL (ref 0.0–1.2)
Total Protein: 6.2 g/dL — ABNORMAL LOW (ref 6.5–8.1)

## 2023-02-18 LAB — DIFFERENTIAL
Abs Immature Granulocytes: 0.02 10*3/uL (ref 0.00–0.07)
Basophils Absolute: 0.1 10*3/uL (ref 0.0–0.1)
Basophils Relative: 1 %
Eosinophils Absolute: 0.2 10*3/uL (ref 0.0–0.5)
Eosinophils Relative: 2 %
Immature Granulocytes: 0 %
Lymphocytes Relative: 22 %
Lymphs Abs: 1.4 10*3/uL (ref 0.7–4.0)
Monocytes Absolute: 0.6 10*3/uL (ref 0.1–1.0)
Monocytes Relative: 10 %
Neutro Abs: 4.2 10*3/uL (ref 1.7–7.7)
Neutrophils Relative %: 65 %

## 2023-02-18 LAB — PROTIME-INR
INR: 0.9 (ref 0.8–1.2)
Prothrombin Time: 12.8 s (ref 11.4–15.2)

## 2023-02-18 LAB — LIPID PANEL
Cholesterol: 169 mg/dL (ref 0–200)
HDL: 46 mg/dL (ref >40)
LDL Cholesterol: 115 mg/dL — ABNORMAL HIGH (ref 0–99)
Total CHOL/HDL Ratio: 3.7 {ratio}
Triglycerides: 39 mg/dL (ref <150)
VLDL: 8 mg/dL (ref 0–40)

## 2023-02-18 LAB — RAPID URINE DRUG SCREEN, HOSP PERFORMED
Amphetamines: NOT DETECTED
Barbiturates: NOT DETECTED
Benzodiazepines: NOT DETECTED
Cocaine: NOT DETECTED
Opiates: NOT DETECTED
Tetrahydrocannabinol: NOT DETECTED

## 2023-02-18 LAB — ETHANOL: Alcohol, Ethyl (B): <10 mg/dL (ref <10)

## 2023-02-18 LAB — APTT: aPTT: 27 s (ref 24–36)

## 2023-02-18 MED ORDER — ASPIRIN 81 MG PO CHEW
81.0000 mg | CHEWABLE_TABLET | Freq: Every day | ORAL | Status: DC
  Administered 2023-02-19: 81 mg via ORAL
  Filled 2023-02-18: qty 1

## 2023-02-18 MED ORDER — CLOPIDOGREL BISULFATE 75 MG PO TABS
75.0000 mg | ORAL_TABLET | Freq: Every day | ORAL | Status: DC
  Administered 2023-02-18 – 2023-02-19 (×2): 75 mg via ORAL
  Filled 2023-02-18 (×2): qty 1

## 2023-02-18 MED ORDER — ASPIRIN 300 MG RE SUPP: 300.0000 mg | Freq: Every day | RECTAL | Status: DC

## 2023-02-18 MED ORDER — IOHEXOL 350 MG/ML SOLN
75.0000 mL | Freq: Once | INTRAVENOUS | Status: AC | PRN
  Administered 2023-02-18: 75 mL via INTRAVENOUS

## 2023-02-18 MED ORDER — HYDRALAZINE HCL 20 MG/ML IJ SOLN
10.0000 mg | INTRAMUSCULAR | Status: AC
  Administered 2023-02-18: 10 mg via INTRAVENOUS
  Filled 2023-02-18: qty 1

## 2023-02-18 MED ORDER — ASPIRIN 81 MG PO CHEW
324.0000 mg | CHEWABLE_TABLET | Freq: Once | ORAL | Status: AC
  Administered 2023-02-18: 324 mg via ORAL
  Filled 2023-02-18: qty 4

## 2023-02-18 NOTE — ED Triage Notes (Signed)
Pt BIB [facility], left face and left back of hand numbness at 1545 and at 1600 symptoms resolved.  Cbg 103 per EMS. Pt denies any numbness or slurred speech.

## 2023-02-18 NOTE — Plan of Care (Signed)
Brief Neuro Phone discussion with ED team:  Briefly, Mr. Jose Villarreal is a 83 y.o. male with hx of HTN who had acute onset L hand and L jaw numbness with spontaneous resolution. Brought in to [facility] ED. CT Head is negative for ICH. He is hypertensive. He has no residual symptoms.  Plan is to admit to [facility] for TIA workup. We do not have neurology available at [facility] over the weekend.  Plan: - Frequent Neuro checks per stroke unit protocol - Recommend brain imaging with MRI Brain without contrast - Recommend Vascular imaging with CTA head and neck - Recommend obtaining TTE - Recommend obtaining Lipid panel with LDL - Please start statin if LDL > 70 - Recommend HbA1c to evaluate for diabetes and how well it is controlled. - Antithrombotic - Aspirin 81mg  daily along with plavix 75mg  daily x 21 days, followed by Aspirin 81mg  daily alone. - Recommend DVT ppx - SBP goal - permissive hypertension first 24 h < 220/110. Held home meds.  - Recommend Telemetry monitoring for arrythmia - Recommend bedside swallow screen prior to PO intake. - Stroke education booklet - Recommend PT/OT/SLP consult   Erick Blinks [facility]

## 2023-02-18 NOTE — ED Notes (Addendum)
Per EMS, pt stated to them upon arrival to house that his symptoms of numbness in arm and face has resolved, pt then stated to both EMS and charge nurse upon arrival to the ED that his Sx are completely gone and lasted only 15 minutes total. No facial droop, equal grip strength bilaterally

## 2023-02-18 NOTE — H&P (Signed)
History and Physical    Patient: Jose Villarreal NFA:[id number] DOB: 04/21/1940 DOA: 02/18/2023 DOS: the patient was seen and examined on 02/19/2023 PCP: Park Meo, FNP  Patient coming from: Home  Chief Complaint:  Chief Complaint  Patient presents with   Numbness   HPI: Jose Villarreal is a 83 y.o. male with medical history significant of hypertension who presents to the emergency department due to complaint numbness of left hand.  Patient states that he was home watching a basketball game when he had an acute onset of numbness on the back of his left hand, this was followed with numbness on left side of his face.  His wife was concerned of stroke, so EMS was activated, on arrival of EMS team, patient symptoms already resolved.  He denies changes in vision, speech or ordination, chest pain, shortness of breath, nausea, vomiting, diarrhea.  ED Course:  In the emergency department, BP was elevated at 184/103, other vital signs were within normal range.  Workup in the ED showed normal CBC and BMP except for sodium of 133.  Urine drug screen was normal, urinalysis was normal, lipid panel was normal except for LDL of 115, alcohol level was less than 10. CT of head without contrast showed no acute intracranial abnormalities. CT angiography head and neck with and without contrast showed no large vessel occlusion, moderate left vertebral artery origin stenosis. Neurology was consulted and recommended further stroke workup.  Patient was initially recommended to be admitted to [facility], but due to long waiting list, it was decided for patient to stay here at [facility] with goal to follow-up with patient via telemetry. Hospitalist was asked to admit patient for further evaluation and management.  Review of Systems: Review of systems as noted in the HPI. All other systems reviewed and are negative.   No past medical history on file. Past Surgical History:  Procedure Laterality Date   EYE SURGERY       Social History:  reports that he has never smoked. He has never used smokeless tobacco. He reports current alcohol use. He reports that he does not use drugs.   Allergies  Allergen Reactions   Alpha-Gal Anaphylaxis    No red meat    Family History  Problem Relation Age of Onset   Parkinson's disease Mother    Heart failure Father      Prior to Admission medications   Medication Sig Start Date End Date Taking? Authorizing Provider  losartan (COZAAR) 50 MG tablet Take 1 tablet (50 mg total) by mouth daily. 05/09/22   Park Meo, FNP    Physical Exam: BP (!) 141/86 (BP Location: Right Arm)   Pulse 73   Temp 97.6 F (36.4 C) (Axillary)   Resp 12   Ht 5\' 8"  (1.727 m)   Wt 79.9 kg   SpO2 95%   BMI 26.79 kg/m   General: 83 y.o. year-old male well developed well nourished in no acute distress.  Alert and oriented x3. HEENT: NCAT, EOMI Neck: Supple, trachea medial Cardiovascular: Regular rate and rhythm with no rubs or gallops.  No thyromegaly or JVD noted.  No lower extremity edema. 2/4 pulses in all 4 extremities. Respiratory: Clear to auscultation with no wheezes or rales. Good inspiratory effort. Abdomen: Soft, nontender nondistended with normal bowel sounds x4 quadrants. Muskuloskeletal: No cyanosis, clubbing or edema noted bilaterally Neuro: CN II-XII intact, strength 5/5 x 4, sensation, reflexes intact, NIHSS 0 Skin: No ulcerative lesions noted or rashes Psychiatry: Judgement  and insight appear normal. Mood is appropriate for condition and setting          Labs on Admission:  Basic Metabolic Panel: Recent Labs  Lab 02/18/23 1749  NA 133*  K 4.8  CL 99  CO2 25  GLUCOSE 93  BUN 11  CREATININE 1.01  CALCIUM 9.0   Liver Function Tests: Recent Labs  Lab 02/18/23 1749  AST 40  ALT 15  ALKPHOS 63  BILITOT 1.2  PROT 6.2*  ALBUMIN 3.7   No results for input(s): "LIPASE", "AMYLASE" in the last 168 hours. No results for input(s): "AMMONIA" in the  last 168 hours. CBC: Recent Labs  Lab 02/18/23 1749  WBC 6.4  NEUTROABS 4.2  HGB 14.2  HCT 42.6  MCV 93.0  PLT 182   Cardiac Enzymes: No results for input(s): "CKTOTAL", "CKMB", "CKMBINDEX", "TROPONINI" in the last 168 hours.  BNP (last 3 results) No results for input(s): "BNP" in the last 8760 hours.  ProBNP (last 3 results) No results for input(s): "PROBNP" in the last 8760 hours.  CBG: No results for input(s): "GLUCAP" in the last 168 hours.  Radiological Exams on Admission: CT ANGIO HEAD NECK W WO CM Result Date: 02/18/2023 CLINICAL DATA:  Stroke/TIA, determine embolic source EXAM: CT ANGIOGRAPHY HEAD AND NECK WITH AND WITHOUT CONTRAST TECHNIQUE: Multidetector CT imaging of the head and neck was performed using the standard protocol during bolus administration of intravenous contrast. Multiplanar CT image reconstructions and MIPs were obtained to evaluate the vascular anatomy. Carotid stenosis measurements (when applicable) are obtained utilizing NASCET criteria, using the distal internal carotid diameter as the denominator. RADIATION DOSE REDUCTION: This exam was performed according to the departmental dose-optimization program which includes automated exposure control, adjustment of the mA and/or kV according to patient size and/or use of iterative reconstruction technique. CONTRAST:  75mL OMNIPAQUE IOHEXOL 350 MG/ML SOLN COMPARISON:  Same day CT head. FINDINGS: CTA NECK FINDINGS Aortic arch: Atherosclerosis.  Great vessel origins are patent. Right carotid system: Atherosclerosis the carotid bifurcation 50 % stenosis. Fusiform aneurysmal dilation at the skull base, measuring up to 9 mm. Left carotid system: Atherosclerosis at the carotid bifurcation without greater than 50% stenosis. Mild irregularity at the skull base without discrete aneurysm. Vertebral arteries: Patent bilaterally. Moderate stenosis of the left vertebral artery origin. Left dominant. Skeleton: No acute abnormality.   Multilevel degenerative change. Other neck: No acute abnormality on limited assessment. Upper chest: Approximately 5 mm pulmonary nodule in the left upper lobe. Review of the MIP images confirms the above findings CTA HEAD FINDINGS Anterior circulation: Bilateral intracranial ICAs, MCAs, and ACAs are patent without proximal hemodynamically significant stenosis Posterior circulation: The right vertebral artery small/non dominant with suspected superimposed stenosis at the dural margin and small intradural vertebral artery terminating as PICA, anatomic variant. The dominant left vertebral artery is widely patent. The basilar artery and bilateral posterior cerebral arteries are patent without proximal hemodynamically significant stenosis. Venous sinuses: As permitted by contrast timing, patent. Anatomic variants: Discussed above. Review of the MIP images confirms the above findings IMPRESSION: 1. No large vessel occlusion. 2. Moderate left vertebral artery origin stenosis. 3. The right vertebral artery small/non dominant with suspected superimposed stenosis at the dural margin and small intradural vertebral artery terminating PICA, anatomic variant. 4. 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 5. Approximately 5 mm pulmonary nodule in the left upper lobe. No follow-up needed if patient is low-risk.This recommendation follows the consensus statement: Guidelines for Management of Incidental  Pulmonary Nodules Detected on CT Images: From the Fleischner Society 2017; Radiology 2017; 284:228-243. 6.  Aortic Atherosclerosis (ICD10-I70.0). Electronically Signed   By: Feliberto Harts M.D.   On: 02/18/2023 21:30   CT HEAD WO CONTRAST Result Date: 02/18/2023 CLINICAL DATA:  Acute neurological deficit with stroke suspected. Left face and hand numbness. Symptoms between 1545 and 1600, subsequently resolved. EXAM: CT HEAD WITHOUT CONTRAST TECHNIQUE: Contiguous axial images were obtained from the base of the skull  through the vertex without intravenous contrast. RADIATION DOSE REDUCTION: This exam was performed according to the departmental dose-optimization program which includes automated exposure control, adjustment of the mA and/or kV according to patient size and/or use of iterative reconstruction technique. COMPARISON:  MRI brain 10/30/2018.  CT head 10/30/2018. FINDINGS: Brain: Diffuse cerebral atrophy. Ventricular dilatation consistent with central atrophy. Low-attenuation changes in the deep white matter consistent with small vessel ischemia. No abnormal extra-axial fluid collections. No mass effect or midline shift. Gray-white matter junctions are distinct. Basal cisterns are not effaced. No acute intracranial hemorrhage. Vascular: No hyperdense vessel or unexpected calcification. Skull: Normal. Negative for fracture or focal lesion. Sinuses/Orbits: Retention cysts in the right maxillary antrum. No acute air-fluid levels. Mastoid air cells are clear. Other: None. IMPRESSION: No acute intracranial abnormalities. Chronic atrophy and small vessel ischemic changes similar to prior study. Electronically Signed   By: Burman Nieves M.D.   On: 02/18/2023 17:36    EKG: I independently viewed the EKG done and my findings are as followed: Sinus bradycardia at a rate of 58 bpm  Assessment/Plan Present on Admission:  Transient ischemic attack  Essential hypertension  Principal Problem:   Transient ischemic attack Active Problems:   Essential hypertension   Pulmonary nodule  Transient ischemic attack Patient will be admitted to telemetry unit  CT of head without contrast showed no acute intracranial abnormalities. CT angiography head and neck with and without contrast showed no large vessel occlusion, moderate left vertebral artery origin stenosis. Echocardiogram in the morning MRI of brain without contrast in the morning Continue aspirin 81 mg and Plavix 75 mg for 21 days, followed by aspirin 81 mg only  daily Continue fall precautions and neuro checks Lipid panel and hemoglobin A1c will be checked Continue PT/SLP/OT eval and treat Bedside swallow eval by nursing prior to diet Neurologist was already consulted by EDP, shall await further recommendation  Essential hypertension  Antihypertensives PRN if Blood pressure is greater than 220/120 or there is a concern for End organ damage/contraindications for permissive HTN. If blood pressure is greater than 220/120 give labetalol PO or IV or Vasotec IV with a goal of 15% reduction in BP during the first 24 hours.  Pulmonary nodule Incidental 5 mm pulmonary nodule was noted in the left upper lobe No follow-up is needed if patient is low risk per radiologist recommendation    DVT prophylaxis: SCDs  Code Status: Full Code   Family Communication: None at bedside   Consults: Neurology  Severity of Illness: The appropriate patient status for this patient is OBSERVATION. Observation status is judged to be reasonable and necessary in order to provide the required intensity of service to ensure the patient's safety. The patient's presenting symptoms, physical exam findings, and initial radiographic and laboratory data in the context of their medical condition is felt to place them at decreased risk for further clinical deterioration. Furthermore, it is anticipated that the patient will be medically stable for discharge from the hospital within 2 midnights of admission.   Author:  Frankey Shown, DO 02/19/2023 12:00 AM  For on call review [email or web address].

## 2023-02-18 NOTE — ED Provider Notes (Signed)
[place] EMERGENCY DEPARTMENT AT [facility] Provider Note   CSN: [id number] Arrival date & time: 02/18/23  1623     History {Add pertinent medical, surgical, social history, OB history to HPI:1} Chief Complaint  Patient presents with   Numbness    Jose Villarreal is a 83 y.o. male.  HPI   This patient is an 83 year old male who states that he takes no daily medications, denies being diagnosed with hypertension cholesterol or diabetes, states he takes no tobacco or alcohol into his body, states that he was watching a basketball game when he felt acute onset of numbness on the back of his left hand followed by some numbness on the left side of his face.  He does not recall if there was anything weak or drooping but the paramedics were called because his spouse who was there was concerned that he may be having a stroke as she has had strokes in the past.  By the time the paramedics arrived the patient reports that his symptoms had resolved.  At this time the patient has no complaints.  No chest pain no shortness of breath no nausea vomiting or diarrhea.  Denies any changes in speech vision gait or coordination.  Home Medications Prior to Admission medications   Medication Sig Start Date End Date Taking? Authorizing Provider  losartan (COZAAR) 50 MG tablet Take 1 tablet (50 mg total) by mouth daily. 05/09/22   Park Meo, FNP      Allergies    Alpha-gal    Review of Systems   Review of Systems  All other systems reviewed and are negative.   Physical Exam Updated Vital Signs BP (!) 184/123 (BP Location: Right Arm)   Pulse 71   Temp 98.2 F (36.8 C)   Resp 16   Ht 1.727 m (5\' 8" )   Wt 79.9 kg   SpO2 98%   BMI 26.79 kg/m  Physical Exam Vitals and nursing note reviewed.  Constitutional:      General: He is not in acute distress.    Appearance: He is well-developed.  HENT:     Head: Normocephalic and atraumatic.     Mouth/Throat:     Pharynx: No  oropharyngeal exudate.  Eyes:     General: No scleral icterus.       Right eye: No discharge.        Left eye: No discharge.     Conjunctiva/sclera: Conjunctivae normal.     Pupils: Pupils are equal, round, and reactive to light.  Neck:     Thyroid: No thyromegaly.     Vascular: No JVD.  Cardiovascular:     Rate and Rhythm: Normal rate and regular rhythm.     Heart sounds: Normal heart sounds. No murmur heard.    No friction rub. No gallop.  Pulmonary:     Effort: Pulmonary effort is normal. No respiratory distress.     Breath sounds: Normal breath sounds. No wheezing or rales.  Abdominal:     General: Bowel sounds are normal. There is no distension.     Palpations: Abdomen is soft. There is no mass.     Tenderness: There is no abdominal tenderness.  Musculoskeletal:        General: No tenderness. Normal range of motion.     Cervical back: Normal range of motion and neck supple.  Lymphadenopathy:     Cervical: No cervical adenopathy.  Skin:    General: Skin is warm and dry.  Findings: No erythema or rash.  Neurological:     Mental Status: He is alert.     Coordination: Coordination normal.     Comments: Speech is clear, cranial nerves III through XII are intact, memory is intact, strength is normal in all 4 extremities including grips and strength at the bilateral thighs, knees and ankles to extention and flexion, sensation is intact to light touch and pinprick in all 4 extremities. Coordination as tested by finger-nose-finger is normal, no limb ataxia. Normal gait, normal reflexes at the patellar tendons bilaterally  Psychiatric:        Behavior: Behavior normal.     ED Results / Procedures / Treatments   Labs (all labs ordered are listed, but only abnormal results are displayed) Labs Reviewed - No data to display  EKG None  Radiology No results found.  Procedures Procedures  {Document cardiac monitor, telemetry assessment procedure when  appropriate:1}  Medications Ordered in ED Medications - No data to display  ED Course/ Medical Decision Making/ A&P   {   Click here for ABCD2, HEART and other calculatorsREFRESH Note before signing :1}                              Medical Decision Making  The patient's exam is unremarkable at this time but his symptoms are strongly concerning for a transient ischemic attack.  The patient will undergo CT scan and I will consult with neurology, he has a blood pressure of 184/123, we will need to keep him on a cardiac monitor while we ensure there is no other causes of his symptoms and make sure nothing comes back.   Co morbidities that complicate the patient evaluation  Elderly hypertensive   Additional history obtained:  Additional history obtained from EMR External records from outside source obtained and reviewed including visits to the office for primary hypertension treated by nurse practitioner as recently as May, followed by orthopedics for knee pain, no recent admissions to the hospital   Lab Tests:  I Ordered, and personally interpreted labs.  The pertinent results include:  ***   Imaging Studies ordered:  I ordered imaging studies including ***  I independently visualized and interpreted imaging which showed *** I agree with the radiologist interpretation   Cardiac Monitoring: / EKG:  The patient was maintained on a cardiac monitor.  I personally viewed and interpreted the cardiac monitored which showed an underlying rhythm of: ***   Consultations Obtained:  I requested consultation with the ***,  and discussed lab and imaging findings as well as pertinent plan - they recommend: ***   Problem List / ED Course / Critical interventions / Medication management  *** I ordered medication including ***  for ***  Reevaluation of the patient after these medicines showed that the patient {resolved/improved/worsened:23923::"improved"} I have reviewed the patients  home medicines and have made adjustments as needed   Social Determinants of Health:  ***   Test / Admission - Considered:  ***   {Document critical care time when appropriate:1} {Document review of labs and clinical decision tools ie heart score, Chads2Vasc2 etc:1}  {Document your independent review of radiology images, and any outside records:1} {Document your discussion with family members, caretakers, and with consultants:1} {Document social determinants of health affecting pt's care:1} {Document your decision making why or why not admission, treatments were needed:1} Final Clinical Impression(s) / ED Diagnoses Final diagnoses:  None    Rx /  DC Orders ED Discharge Orders     None

## 2023-02-19 ENCOUNTER — Observation Stay (HOSPITAL_BASED_OUTPATIENT_CLINIC_OR_DEPARTMENT_OTHER): Payer: Medicare PPO

## 2023-02-19 ENCOUNTER — Observation Stay (HOSPITAL_COMMUNITY): Payer: Medicare PPO

## 2023-02-19 ENCOUNTER — Encounter (HOSPITAL_COMMUNITY): Payer: Self-pay | Admitting: Internal Medicine

## 2023-02-19 DIAGNOSIS — G459 Transient cerebral ischemic attack, unspecified: Secondary | ICD-10-CM | POA: Diagnosis not present

## 2023-02-19 DIAGNOSIS — I720 Aneurysm of carotid artery: Secondary | ICD-10-CM

## 2023-02-19 DIAGNOSIS — I714 Abdominal aortic aneurysm, without rupture, unspecified: Secondary | ICD-10-CM | POA: Diagnosis not present

## 2023-02-19 DIAGNOSIS — I10 Essential (primary) hypertension: Secondary | ICD-10-CM | POA: Diagnosis not present

## 2023-02-19 DIAGNOSIS — R911 Solitary pulmonary nodule: Secondary | ICD-10-CM | POA: Insufficient documentation

## 2023-02-19 LAB — HEMOGLOBIN A1C
Hgb A1c MFr Bld: 5.1 % (ref 4.8–5.6)
Mean Plasma Glucose: 99.67 mg/dL

## 2023-02-19 LAB — ECHOCARDIOGRAM COMPLETE
AR max vel: 1.45 cm2
AV Area VTI: 1.57 cm2
AV Area mean vel: 1.53 cm2
AV Mean grad: 11.7 mm[Hg]
AV Peak grad: 18.7 mm[Hg]
Ao pk vel: 2.16 m/s
Area-P 1/2: 3.19 cm2
Calc EF: 58.3 %
Height: 68 in
Single Plane A2C EF: 45.6 %
Single Plane A4C EF: 69 %
Weight: 2819.2 [oz_av]

## 2023-02-19 MED ORDER — ASPIRIN 81 MG PO TBEC: 81.0000 mg | DELAYED_RELEASE_TABLET | Freq: Every day | ORAL | 0 refills | Status: AC

## 2023-02-19 MED ORDER — CLOPIDOGREL BISULFATE 75 MG PO TABS: 75.0000 mg | ORAL_TABLET | Freq: Every day | ORAL | 0 refills | Status: AC

## 2023-02-19 MED ORDER — ATORVASTATIN CALCIUM 40 MG PO TABS: 40.0000 mg | ORAL_TABLET | Freq: Every day | ORAL | 0 refills | Status: AC

## 2023-02-19 MED ORDER — ATORVASTATIN CALCIUM 40 MG PO TABS
40.0000 mg | ORAL_TABLET | Freq: Every day | ORAL | Status: DC
Start: 2023-02-19 — End: 2023-02-19
  Administered 2023-02-19: 40 mg via ORAL
  Filled 2023-02-19: qty 1

## 2023-02-19 MED ORDER — ATORVASTATIN CALCIUM 40 MG PO TABS: 40.0000 mg | ORAL_TABLET | Freq: Every day | ORAL | Status: DC

## 2023-02-19 NOTE — Progress Notes (Signed)
Transition of Care Department [facility]) has reviewed patient and no other TOC needs have been identified at this time. We will continue to monitor patient advancement through interdisciplinary progression rounds. If new patient transition needs arise, please place a TOC consult.   02/19/23 1107  TOC Brief Assessment  Insurance and Status Reviewed  Patient has primary care physician Yes  Home environment has been reviewed Lives with wife.  Prior level of function: Independent.  Prior/Current Home Services No current home services  Social Drivers of Health Review SDOH reviewed no interventions necessary  Readmission risk has been reviewed Yes  Transition of care needs no transition of care needs at this time

## 2023-02-19 NOTE — Progress Notes (Signed)
SLP Cancellation Note  Patient Details Name: Jose Villarreal MRN: [id number] DOB: [birth year]   Cancelled treatment:       Reason Eval/Treat Not Completed: SLP screened, no needs identified, will sign off. Pt passed the [place] and is tolerating reg/thin diet. Will sign off, thank you  Nea Gittens H. Romie Levee, CCC-SLP Speech Language Pathologist    Georgetta Haber 02/19/2023, 1:17 PM

## 2023-02-19 NOTE — Consult Note (Signed)
[facility] Telemedicine Consult   Requesting Provider: Eric [place] Consult Participants: Myself, patient, bedside nurse French Ana Location of the provider: [facility] Location of the patient: [facility]   This consult was provided via telemedicine with 2-way video and audio communication. The patient/family was informed that care would be provided in this way and agreed to receive care in this manner.   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  He initially thought this was due to ulnar nerve compression and just tried to move the hand to see if he could restore sensation but as he was doing this he realized he was also having tingling in the face (within 30 seconds of onset of symptoms in his hand).  EMS was activated for concern for stroke but his symptoms had already resolved (lasting less than 5 minutes total).  No other recent neurological symptoms.  Of note he was last seen by a primary care physician in May 2024 but he reports he would rather not be seen by doctors in general and he has not scheduled his follow-up aortic abdominal ultrasound which was ordered to monitor his aneurysm  LKW: 2/1 at 1545 Thrombolytic given?: No, symptoms resolved, concern for aortic abdominal aneurysm IR Thrombectomy? No, symptoms resolved, exam not consistent with LVO  Modified Rankin Scale: 0-Completely asymptomatic and back to baseline post- stroke -- still drives, no concerns about getting lost or having other cognitive issues Time of teleneurologist evaluation: noon on 2/2  Current vital signs: BP (!) 166/84 (BP Location: Left Arm)   Pulse 69   Temp 98 F (36.7 C) (Oral)   Resp 19   Ht 5\' 8"  (1.727 m)   Wt 79.9 kg    SpO2 98%   BMI 26.79 kg/m  Vital signs in last 24 hours: Temp:  [97.4 F (36.3 C)-98.2 F (36.8 C)] 98 F (36.7 C) (02/02 0526) Pulse Rate:  [65-81] 69 (02/02 0526) Resp:  [11-20] 19 (02/02 0526) BP: (141-190)/(74-123) 166/84 (02/02 0526) SpO2:  [95 %-99 %] 98 % (02/02 0526) Weight:  [79.4 kg-79.9 kg] 79.9 kg (02/01 1645)   Exam: Vitals:   02/19/23 0400 02/19/23 0526  BP: (!) 172/74 (!) 166/84  Pulse: 76 69  Resp: 18 19  Temp: (!) 97.5 F (36.4 C) 98 F (36.7 C)  SpO2: 99% 98%    General: No acute distress Pulmonary: breathing comfortably Cardiac: Perfusing extremities well  NIH Stroke scale 1A: Level of Consciousness - 0 1B: Ask Month and Age - 0 1C: 'Blink Eyes' & 'Squeeze Hands' - 0 2: Test Horizontal Extraocular Movements - 0 3: Test Visual Fields - 0 4: Test Facial Palsy - 0 5A: Test Left Arm Motor Drift - 0 mild pronation of the left upper extremity without drift 5B: Test Right Arm Motor Drift - 0 6A: Test Left Leg Motor Drift - 0 6B: Test Right Leg Motor Drift - 0 7: Test Limb Ataxia - 1 -- baseline bilateral upper extremity intention tremor, no true ataxia, however did seem to be ataxic in the left lower extremity 8: Test Sensation - 0 9: Test Language/Aphasia- 0 10: Test Dysarthria - 0 11: Test Extinction/Inattention - 0 NIHSS score: 1   Imaging Reviewed:   CTA head  and neck personally reviewed, agree with radiology:   1. No large vessel occlusion. 2. Moderate left vertebral artery origin stenosis. 3. The right vertebral artery small/non dominant with suspected superimposed stenosis at the dural margin and small intradural vertebral artery terminating PICA, anatomic variant. 4. 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 5. Approximately 5 mm pulmonary nodule in the left upper lobe. No follow-up needed if patient is low-risk.This recommendation follows the consensus statement: Guidelines for Management of Incidental Pulmonary Nodules  Detected on CT Images: From the Fleischner Society 2017; Radiology 2017; 284:228-243. 6.  Aortic Atherosclerosis (ICD10-I70.0).  MRI brain personally reviewed, agree with radiology:   1. No acute finding including infarct. 2. Chronic small vessel ischemia in the cerebral white matter.  Labs reviewed in epic and pertinent values follow:  Basic Metabolic Panel: Recent Labs  Lab 02/18/23 1749  NA 133*  K 4.8  CL 99  CO2 25  GLUCOSE 93  BUN 11  CREATININE 1.01  CALCIUM 9.0    CBC: Recent Labs  Lab 02/18/23 1749  WBC 6.4  NEUTROABS 4.2  HGB 14.2  HCT 42.6  MCV 93.0  PLT 182    Coagulation Studies: Recent Labs    02/18/23 1749  LABPROT 12.8  INR 0.9    No results found for: "HGBA1C"   Lab Results  Component Value Date   CHOL 169 02/18/2023   HDL 46 02/18/2023   LDLCALC 115 (H) 02/18/2023   TRIG 39 02/18/2023   CHOLHDL 3.7 02/18/2023      Assessment: TIA in a patient with vascular risk factors of age, hypertension, hyperlipidemia.  Also history of aortic abdominal aneurysm lost to follow-up, which needs further monitoring given potential impact safety of anticoagulation if for example paroxysmal atrial fibrillation is discovered  Recommendations:   # TIA - 21 days of DAPT, followed by aspirin monotherapy - Atorva 40 mg nightly  - Goal A1c < 7% (result pending, adjust meds as needed) - if there is left atrial enlargement on ECHO, event monitor on discharge or in close outpatient follow-up  # Fusiform 9 mm right ICA anerusym - Outpatient follow-up with neurointerventional radiology  #History of abdominal aortic aneurysm - Patient encouraged to follow-up outpatient as previously recommended  Brooke Dare MD-PhD [facility] [phone] If 8pm-8am, please page neurology on call as listed in AMION.  Discussed with Dr. [place] via secure chat

## 2023-02-19 NOTE — Plan of Care (Signed)
  Problem: Ischemic Stroke/TIA Tissue Perfusion: Goal: Complications of ischemic stroke/TIA will be minimized Outcome: Progressing   Problem: Coping: Goal: Will verbalize positive feelings about self Outcome: Progressing Goal: Will identify appropriate support needs Outcome: Progressing   Problem: Health Behavior/Discharge Planning: Goal: Ability to manage health-related needs will improve Outcome: Progressing Goal: Goals will be collaboratively established with patient/family Outcome: Progressing   Problem: Nutrition: Goal: Risk of aspiration will decrease Outcome: Progressing Goal: Dietary intake will improve Outcome: Progressing

## 2023-02-19 NOTE — Discharge Summary (Signed)
Physician Discharge Summary  Jose Villarreal:[id number] DOB: [birth year] DOA: 02/18/2023  PCP: Park Meo, FNP  Admit date: 02/18/2023 Discharge date: 02/19/2023  Admitted From: Home Disposition: Home  Recommendations for Outpatient Follow-up:  Follow up with PCP in 1-2 weeks Follow-up with neurology 6 weeks for TIA follow-up Recommend outpatient follow-up with neuro interventional radiology for right ICA aneurysm Recommend outpatient follow-up with vascular surgery for further monitoring of AAA Continue dual antiplatelet therapy with aspirin/Plavix x 21 days followed by aspirin monotherapy alone Started on atorvastatin 40 mg p.o. daily, goal LDL less than 70 Follow-up echocardiogram with read pending at time of discharge, patient declined to stay until complete, if there is left atrial enlargement neurology recommended outpatient cardiac monitor  Home Health: No needs identified by physical therapy Equipment/Devices: None  Discharge Condition: Stable CODE STATUS: Full code Diet recommendation: Heart healthy diet  History of present illness:  Jose Villarreal is a 83 year old male with past medical history significant for HTN, aortic abdominal aneurysm who presented to [facility] ED on 2/1 with acute onset numbness to his left face and hands.  Symptoms lasted for roughly less than 5 minutes with self resolution.  Patient denied headache, no visual disturbance, no chest pain, no palpitations, no shortness of breath, no abdominal pain, no fever/chills/night sweats, no nausea/vomiting/diarrhea, no urinary symptoms, no focal weakness, no fatigue, no cough/congestion.  Given his symptoms, EMS was activated and patient was brought to the ED for further evaluation and management.  In the ED, temperature 98.2 F, HR 71, RR 16, BP 184/123, SpO2 98% on room air.  WBC 6.4, hemoglobin 14.2, platelet count 182.  Sodium 133, potassium 4.8, chloride 99, CO2 25, glucose 93, BUN 11, creat  1.01.  AST 40, ALT 15, total bilirubin 1.2.  Urinalysis unrevealing.  UDS negative.  EtOH level less than 10.  CT head without contrast with no acute intracranial Gershon Mussel, chronic atrophy and small vessel ischemic changes.  CT angiogram head/neck with no large vessel occlusion, moderate left vertebral artery origin stenosis, right vertebral artery small/nondominant with suspected superimposed stenosis at the dural margin, fusiform 9 mm right ICA aneurysm of the skull base, 5 mm pulmonary nodule left upper lobe.  TRH was consulted for further evaluation and management of concern for TIA.  Hospital course:  Transient ischemic attack Patient presenting with acute onset numbness to his left face and hands that self resolved lasting less than 5 minutes.  Denied any focal weakness, slurred speech, gait disturbance or difficulty swallowing.  Neurology was consulted and followed during the hospital course. CT head without contrast with no acute intracranial Gershon Mussel, chronic atrophy and small vessel ischemic changes.  CT angiogram head/neck with no large vessel occlusion, moderate left vertebral artery origin stenosis, right vertebral artery small/nondominant with suspected superimposed stenosis at the dural margin, fusiform 9 mm right ICA aneurysm of the skull base.  MRI brain without contrast with no acute finding including infarct, chronic small vessel ischemia in the cerebral white matter.  LDL 115.  Hemoglobin A1c 5.1.  TTE completed with cardiology read pending at time of discharge.  Seen by PT with no needs identified.  Will continue dual antiplatelet therapy with aspirin and Plavix followed by aspirin monotherapy thereafter.  Started on atorvastatin 40 mg p.o. daily.  Outpatient follow-up with neurology 6-8 weeks.  Left atrial enlargement noted on TTE, cardiology recommends outpatient cardiac monitor.  Right ICA aneurysm CT angiogram head/neck notable for fusiform 9 mm right ICA aneurysm.  Recommend  outpatient follow-up with neurointerventional radiology.  Fusiform 9 mm right ICA anerusym Outpatient follow-up with neurointerventional radiology  History of abdominal aortic aneurysm Patient encouraged to follow-up outpatient as previously recommended   Essential hypertension Continue losartan  Pulmonary nodule Incidental finding on CT angiogram head/neck of 5 mm pulmonary nodule left upper lobe.  Given his age, unlikely need for further surveillance at this point.  Outpatient follow-up with PCP.  Discharge Diagnoses:  Principal Problem:   Transient ischemic attack Active Problems:   Essential hypertension   Pulmonary nodule    Discharge Instructions  Discharge Instructions     Ambulatory referral to Neurology   Complete by: As directed    An appointment is requested in approximately: 6 weeks, TIA follow-up   Ambulatory referral to Vascular Surgery   Complete by: As directed    Call MD for:  difficulty breathing, headache or visual disturbances   Complete by: As directed    Call MD for:  extreme fatigue   Complete by: As directed    Call MD for:  persistant dizziness or light-headedness   Complete by: As directed    Call MD for:  persistant nausea and vomiting   Complete by: As directed    Call MD for:  severe uncontrolled pain   Complete by: As directed    Call MD for:  temperature >100.4   Complete by: As directed    Diet - low sodium heart healthy   Complete by: As directed    Increase activity slowly   Complete by: As directed       Allergies as of 02/19/2023       Reactions   Alpha-gal Anaphylaxis   No red meat        Medication List     TAKE these medications    aspirin EC 81 MG tablet Take 1 tablet (81 mg total) by mouth daily. Swallow whole.   atorvastatin 40 MG tablet Commonly known as: LIPITOR Take 1 tablet (40 mg total) by mouth at bedtime. Start taking on: February 20, 2023   clopidogrel 75 MG tablet Commonly known as: Plavix Take  1 tablet (75 mg total) by mouth daily for 21 days.   losartan 50 MG tablet Commonly known as: COZAAR Take 1 tablet (50 mg total) by mouth daily.        Follow-up Information     Park Meo, FNP. Schedule an appointment as soon as possible for a visit in 1 week(s).   Specialty: Family Medicine Contact information: [address] [place] [address] [phone]         [facility]. Schedule an appointment as soon as possible for a visit in 6 week(s).   Specialty: Neurology Contact information: [address] [place] [address] [phone]               Allergies  Allergen Reactions   Alpha-Gal Anaphylaxis    No red meat    Consultations: Neurology   Procedures/Studies: MR BRAIN WO CONTRAST Result Date: 02/19/2023 CLINICAL DATA:  Neuro deficit with acute stroke suspected EXAM: MRI HEAD WITHOUT CONTRAST TECHNIQUE: Multiplanar, multiecho pulse sequences of the brain and surrounding structures were obtained without intravenous contrast. COMPARISON:  Head CT and CTA from yesterday FINDINGS: Brain: No acute infarction, hemorrhage, hydrocephalus, extra-axial collection or mass lesion. Chronic small vessel ischemic gliosis in the cerebral white matter to a moderate degree. Small dilated perivascular spaces or lacunes in the deep  cerebellum, more prominent on the right. Mild for age cerebral volume loss. Vascular: Major flow voids are preserved.  There is recent CTA. Skull and upper cervical spine: Normal marrow signal. Joint effusion at the left TMJ. Sinuses/Orbits: Retention cyst appearance along the floor of the right maxillary sinus. IMPRESSION: 1. No acute finding including infarct. 2. Chronic small vessel ischemia in the cerebral white matter. Electronically Signed   By: Tiburcio Pea M.D.   On: 02/19/2023 09:05   CT ANGIO HEAD NECK W WO CM Result Date: 02/18/2023 CLINICAL DATA:  Stroke/TIA, determine  embolic source EXAM: CT ANGIOGRAPHY HEAD AND NECK WITH AND WITHOUT CONTRAST TECHNIQUE: Multidetector CT imaging of the head and neck was performed using the standard protocol during bolus administration of intravenous contrast. Multiplanar CT image reconstructions and MIPs were obtained to evaluate the vascular anatomy. Carotid stenosis measurements (when applicable) are obtained utilizing NASCET criteria, using the distal internal carotid diameter as the denominator. RADIATION DOSE REDUCTION: This exam was performed according to the departmental dose-optimization program which includes automated exposure control, adjustment of the mA and/or kV according to patient size and/or use of iterative reconstruction technique. CONTRAST:  75mL OMNIPAQUE IOHEXOL 350 MG/ML SOLN COMPARISON:  Same day CT head. FINDINGS: CTA NECK FINDINGS Aortic arch: Atherosclerosis.  Great vessel origins are patent. Right carotid system: Atherosclerosis the carotid bifurcation 50 % stenosis. Fusiform aneurysmal dilation at the skull base, measuring up to 9 mm. Left carotid system: Atherosclerosis at the carotid bifurcation without greater than 50% stenosis. Mild irregularity at the skull base without discrete aneurysm. Vertebral arteries: Patent bilaterally. Moderate stenosis of the left vertebral artery origin. Left dominant. Skeleton: No acute abnormality.  Multilevel degenerative change. Other neck: No acute abnormality on limited assessment. Upper chest: Approximately 5 mm pulmonary nodule in the left upper lobe. Review of the MIP images confirms the above findings CTA HEAD FINDINGS Anterior circulation: Bilateral intracranial ICAs, MCAs, and ACAs are patent without proximal hemodynamically significant stenosis Posterior circulation: The right vertebral artery small/non dominant with suspected superimposed stenosis at the dural margin and small intradural vertebral artery terminating as PICA, anatomic variant. The dominant left vertebral  artery is widely patent. The basilar artery and bilateral posterior cerebral arteries are patent without proximal hemodynamically significant stenosis. Venous sinuses: As permitted by contrast timing, patent. Anatomic variants: Discussed above. Review of the MIP images confirms the above findings IMPRESSION: 1. No large vessel occlusion. 2. Moderate left vertebral artery origin stenosis. 3. The right vertebral artery small/non dominant with suspected superimposed stenosis at the dural margin and small intradural vertebral artery terminating PICA, anatomic variant. 4. 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 5. Approximately 5 mm pulmonary nodule in the left upper lobe. No follow-up needed if patient is low-risk.This recommendation follows the consensus statement: Guidelines for Management of Incidental Pulmonary Nodules Detected on CT Images: From the Fleischner Society 2017; Radiology 2017; 284:228-243. 6.  Aortic Atherosclerosis (ICD10-I70.0). Electronically Signed   By: Feliberto Harts M.D.   On: 02/18/2023 21:30   CT HEAD WO CONTRAST Result Date: 02/18/2023 CLINICAL DATA:  Acute neurological deficit with stroke suspected. Left face and hand numbness. Symptoms between 1545 and 1600, subsequently resolved. EXAM: CT HEAD WITHOUT CONTRAST TECHNIQUE: Contiguous axial images were obtained from the base of the skull through the vertex without intravenous contrast. RADIATION DOSE REDUCTION: This exam was performed according to the departmental dose-optimization program which includes automated exposure control, adjustment of the mA and/or kV according to patient size and/or use  of iterative reconstruction technique. COMPARISON:  MRI brain 10/30/2018.  CT head 10/30/2018. FINDINGS: Brain: Diffuse cerebral atrophy. Ventricular dilatation consistent with central atrophy. Low-attenuation changes in the deep white matter consistent with small vessel ischemia. No abnormal extra-axial fluid collections. No mass  effect or midline shift. Gray-white matter junctions are distinct. Basal cisterns are not effaced. No acute intracranial hemorrhage. Vascular: No hyperdense vessel or unexpected calcification. Skull: Normal. Negative for fracture or focal lesion. Sinuses/Orbits: Retention cysts in the right maxillary antrum. No acute air-fluid levels. Mastoid air cells are clear. Other: None. IMPRESSION: No acute intracranial abnormalities. Chronic atrophy and small vessel ischemic changes similar to prior study. Electronically Signed   By: Burman Nieves M.D.   On: 02/18/2023 17:36     Subjective: Patient seen examined bedside, resting calmly.  Sitting in bedside chair.  Multiple family members present.  No specific complaints, symptoms of left facial numbness and left hand numbness remain resolved.  MRI negative for acute infarct.  Seen by neurology with recommendation of DAPT x 21 days followed by aspirin alone, started on atorvastatin for elevated LDL of 115.  Seen by physical therapy no needs identified.  No other specific complaints, concerns or questions at this time.  Denies headache, no dizziness, no chest pain, no palpitations, no shortness of breath, no abdominal pain, no fever/chills/night sweats, no nausea/vomiting/diarrhea, no focal weakness, no fatigue, no current paresthesia.  No acute events overnight per nursing staff.  Discharge Exam: Vitals:   02/19/23 0400 02/19/23 0526  BP: (!) 172/74 (!) 166/84  Pulse: 76 69  Resp: 18 19  Temp: (!) 97.5 F (36.4 C) 98 F (36.7 C)  SpO2: 99% 98%   Vitals:   02/19/23 0011 02/19/23 0154 02/19/23 0400 02/19/23 0526  BP: (!) 168/91 (!) 144/90 (!) 172/74 (!) 166/84  Pulse: 81 75 76 69  Resp: 18 20 18 19   Temp: 97.9 F (36.6 C) (!) 97.4 F (36.3 C) (!) 97.5 F (36.4 C) 98 F (36.7 C)  TempSrc: Oral Oral Oral Oral  SpO2: 99% 98% 99% 98%  Weight:      Height:        Physical Exam: GEN: NAD, alert and oriented x 3, elderly in appearance HEENT: NCAT,  PERRL, EOMI, sclera clear, MMM PULM: CTAB w/o wheezes/crackles, normal respiratory effort, on room air CV: RRR w/o M/G/R GI: abd soft, NTND, NABS, no R/G/M MSK: no peripheral edema, muscle strength globally intact 5/5 bilateral upper/lower extremities NEURO: CN II-XII intact, no focal deficits, sensation to light touch intact PSYCH: normal mood/affect Integumentary: dry/intact, no rashes or wounds    The results of significant diagnostics from this hospitalization (including imaging, microbiology, ancillary and laboratory) are listed below for reference.     Microbiology: No results found for this or any previous visit (from the past 240 hours).   Labs: BNP (last 3 results) No results for input(s): "BNP" in the last 8760 hours. Basic Metabolic Panel: Recent Labs  Lab 02/18/23 1749  NA 133*  K 4.8  CL 99  CO2 25  GLUCOSE 93  BUN 11  CREATININE 1.01  CALCIUM 9.0   Liver Function Tests: Recent Labs  Lab 02/18/23 1749  AST 40  ALT 15  ALKPHOS 63  BILITOT 1.2  PROT 6.2*  ALBUMIN 3.7   No results for input(s): "LIPASE", "AMYLASE" in the last 168 hours. No results for input(s): "AMMONIA" in the last 168 hours. CBC: Recent Labs  Lab 02/18/23 1749  WBC 6.4  NEUTROABS 4.2  HGB 14.2  HCT 42.6  MCV 93.0  PLT 182   Cardiac Enzymes: No results for input(s): "CKTOTAL", "CKMB", "CKMBINDEX", "TROPONINI" in the last 168 hours. BNP: Invalid input(s): "POCBNP" CBG: No results for input(s): "GLUCAP" in the last 168 hours. D-Dimer No results for input(s): "DDIMER" in the last 72 hours. Hgb A1c Recent Labs    02/18/23 1749  HGBA1C 5.1   Lipid Profile Recent Labs    02/18/23 1714  CHOL 169  HDL 46  LDLCALC 115*  TRIG 39  CHOLHDL 3.7   Thyroid function studies No results for input(s): "TSH", "T4TOTAL", "T3FREE", "THYROIDAB" in the last 72 hours.  Invalid input(s): "FREET3" Anemia work up No results for input(s): "VITAMINB12", "FOLATE", "FERRITIN", "TIBC",  "IRON", "RETICCTPCT" in the last 72 hours. Urinalysis    Component Value Date/Time   COLORURINE STRAW (A) 02/18/2023 1704   APPEARANCEUR CLEAR 02/18/2023 1704   LABSPEC 1.008 02/18/2023 1704   PHURINE 7.0 02/18/2023 1704   GLUCOSEU NEGATIVE 02/18/2023 1704   HGBUR NEGATIVE 02/18/2023 1704   BILIRUBINUR NEGATIVE 02/18/2023 1704   KETONESUR NEGATIVE 02/18/2023 1704   PROTEINUR NEGATIVE 02/18/2023 1704   UROBILINOGEN 0.2 03/11/2007 1120   NITRITE NEGATIVE 02/18/2023 1704   LEUKOCYTESUR NEGATIVE 02/18/2023 1704   Sepsis Labs Recent Labs  Lab 02/18/23 1749  WBC 6.4   Microbiology No results found for this or any previous visit (from the past 240 hours).   Time coordinating discharge: Over 30 minutes  SIGNED:   Alvira Philips [place], DO  [facility] 02/19/2023, 1:18 PM

## 2023-02-19 NOTE — Progress Notes (Signed)
PT Screen/Discharge Note  Patient Details Name: Jose Villarreal MRN: [id number] DOB: Apr 08, 1940   Cancelled Treatment:    Reason Eval/Treat Not Completed: PT screened, no needs identified, will sign off  Pt pleasantly resting in bed with wife, and two children in room. Pt informed of Acute PT and services. Pt reporting he has no needs and he is at his functional baseline. Pt reports their home is setup to ease transition and accessibility with aging. Pt's wife and children confirm. Pt reports he is back to normal, independent at baseline and spends time outside. Pt's family confirms as well. When asked if pt has any balance concerns or concerns with with DC home, he reports no. Pt independent stood up from bed at end of screen. No acute PT needs are indicating as pt is at baseline is and is declining formal PT evaluation at this time. Reports all his symptoms have resolved. PT to sign off.   Nelida Meuse PT, DPT [facility] [phone] office  Nelida Meuse 02/19/2023, 10:52 AM

## 2023-02-19 NOTE — Care Management Obs Status (Signed)
MEDICARE OBSERVATION STATUS NOTIFICATION   Patient Details  Name: Jose Villarreal MRN: [id number] Date of Birth: 06/15/1940   Medicare Observation Status Notification Given:  Yes  I, Karn Cassis, LCSW, verbally reviewed observation notice.  02/19/2023, 10:31 AM   Karn Cassis, LCSW 02/19/2023, 10:31 AM

## 2023-02-19 NOTE — Plan of Care (Signed)
  Problem: Education: Goal: Knowledge of General Education information will improve Description: Including pain rating scale, medication(s)/side effects and non-pharmacologic comfort measures Outcome: Adequate for Discharge   Problem: Health Behavior/Discharge Planning: Goal: Ability to manage health-related needs will improve Outcome: Adequate for Discharge   Problem: Clinical Measurements: Goal: Ability to maintain clinical measurements within normal limits will improve Outcome: Adequate for Discharge Goal: Will remain free from infection Outcome: Adequate for Discharge Goal: Diagnostic test results will improve Outcome: Adequate for Discharge Goal: Respiratory complications will improve Outcome: Adequate for Discharge Goal: Cardiovascular complication will be avoided Outcome: Adequate for Discharge   Problem: Activity: Goal: Risk for activity intolerance will decrease Outcome: Adequate for Discharge   Problem: Nutrition: Goal: Adequate nutrition will be maintained Outcome: Adequate for Discharge   Problem: Coping: Goal: Level of anxiety will decrease Outcome: Adequate for Discharge   Problem: Elimination: Goal: Will not experience complications related to bowel motility Outcome: Adequate for Discharge Goal: Will not experience complications related to urinary retention Outcome: Adequate for Discharge   Problem: Pain Managment: Goal: General experience of comfort will improve and/or be controlled Outcome: Adequate for Discharge   Problem: Safety: Goal: Ability to remain free from injury will improve Outcome: Adequate for Discharge   Problem: Skin Integrity: Goal: Risk for impaired skin integrity will decrease Outcome: Adequate for Discharge   Problem: Education: Goal: Knowledge of disease or condition will improve Outcome: Adequate for Discharge Goal: Knowledge of secondary prevention will improve (MUST DOCUMENT ALL) Outcome: Adequate for Discharge Goal:  Knowledge of patient specific risk factors will improve (DELETE if not current risk factor) Outcome: Adequate for Discharge   Problem: Ischemic Stroke/TIA Tissue Perfusion: Goal: Complications of ischemic stroke/TIA will be minimized Outcome: Adequate for Discharge   Problem: Coping: Goal: Will verbalize positive feelings about self Outcome: Adequate for Discharge Goal: Will identify appropriate support needs Outcome: Adequate for Discharge   Problem: Health Behavior/Discharge Planning: Goal: Ability to manage health-related needs will improve Outcome: Adequate for Discharge Goal: Goals will be collaboratively established with patient/family Outcome: Adequate for Discharge   Problem: Self-Care: Goal: Ability to participate in self-care as condition permits will improve Outcome: Adequate for Discharge Goal: Verbalization of feelings and concerns over difficulty with self-care will improve Outcome: Adequate for Discharge Goal: Ability to communicate needs accurately will improve Outcome: Adequate for Discharge   Problem: Nutrition: Goal: Risk of aspiration will decrease Outcome: Adequate for Discharge Goal: Dietary intake will improve Outcome: Adequate for Discharge

## 2023-03-03 ENCOUNTER — Ambulatory Visit: Payer: Medicare PPO | Admitting: Orthopedic Surgery
